# Patient Record
Sex: Female | Born: 1990 | Race: Black or African American | Hispanic: No | Smoking: Never smoker
Health system: Southern US, Community
[De-identification: ages and names within clinical notes are randomized; demographics above are authoritative.]

## PROBLEM LIST (undated history)

## (undated) DIAGNOSIS — N760 Acute vaginitis: Principal | ICD-10-CM

## (undated) DIAGNOSIS — B9689 Other specified bacterial agents as the cause of diseases classified elsewhere: Secondary | ICD-10-CM

## (undated) DIAGNOSIS — D509 Iron deficiency anemia, unspecified: Secondary | ICD-10-CM

## (undated) DIAGNOSIS — O99019 Anemia complicating pregnancy, unspecified trimester: Secondary | ICD-10-CM

## (undated) DIAGNOSIS — R801 Persistent proteinuria, unspecified: Secondary | ICD-10-CM

## (undated) DIAGNOSIS — O1490 Unspecified pre-eclampsia, unspecified trimester: Secondary | ICD-10-CM

## (undated) HISTORY — PX: UMBILICAL HERNIA REPAIR: SHX196

---
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
## 2012-02-23 ENCOUNTER — Emergency Department: Payer: Self-pay | Admitting: Emergency Medicine

## 2012-02-23 LAB — URINALYSIS, COMPLETE
Bacteria: NONE SEEN
Glucose,UR: NEGATIVE mg/dL (ref 0–75)
Nitrite: NEGATIVE
Protein: >=500
WBC UR: 17 /HPF (ref 0–5)

## 2012-02-23 LAB — CBC
HCT: 34 % — ABNORMAL LOW (ref 35.0–47.0)
HGB: 10.9 g/dL — ABNORMAL LOW (ref 12.0–16.0)
MCH: 23.6 pg — ABNORMAL LOW (ref 26.0–34.0)
MCHC: 32 g/dL (ref 32.0–36.0)
RBC: 4.6 10*6/uL (ref 3.80–5.20)
WBC: 4.8 10*3/uL (ref 3.6–11.0)

## 2012-02-23 LAB — COMPREHENSIVE METABOLIC PANEL
Albumin: 3.9 g/dL (ref 3.8–5.6)
Alkaline Phosphatase: 67 U/L — ABNORMAL LOW (ref 82–169)
BUN: 12 mg/dL (ref 7–18)
Bilirubin,Total: 0.4 mg/dL (ref 0.2–1.0)
Creatinine: 0.46 mg/dL — ABNORMAL LOW (ref 0.60–1.30)
EGFR (African American): >60
EGFR (Non-African Amer.): >60
Glucose: 76 mg/dL (ref 65–99)
Osmolality: 270 (ref 275–301)
SGPT (ALT): 12 U/L (ref 12–78)
Sodium: 136 mmol/L (ref 136–145)

## 2012-02-23 LAB — HCG, QUANTITATIVE, PREGNANCY: Beta Hcg, Quant.: 191228 m[IU]/mL — ABNORMAL HIGH

## 2012-03-08 ENCOUNTER — Encounter: Payer: Self-pay | Admitting: Maternal & Fetal Medicine

## 2012-03-08 LAB — URINALYSIS, COMPLETE
Bilirubin,UR: NEGATIVE
Blood: NEGATIVE
Glucose,UR: 50 mg/dL (ref 0–75)
Leukocyte Esterase: NEGATIVE
Nitrite: NEGATIVE
Ph: 7 (ref 4.5–8.0)
Specific Gravity: 1.026 (ref 1.003–1.030)
Squamous Epithelial: 2
WBC UR: 6 /HPF (ref 0–5)

## 2012-03-09 ENCOUNTER — Other Ambulatory Visit: Payer: Self-pay | Admitting: Maternal & Fetal Medicine

## 2012-03-09 LAB — PROTEIN, URINE, 24 HOUR
Protein, 24 Hour Urine: 600 mg/24HR — ABNORMAL HIGH (ref 30–149)
Protein, Urine: 100 mg/dL (ref 0–12)

## 2012-04-05 ENCOUNTER — Encounter: Payer: Self-pay | Admitting: Obstetrics and Gynecology

## 2012-05-08 ENCOUNTER — Ambulatory Visit: Payer: Self-pay | Admitting: Hematology and Oncology

## 2012-05-24 ENCOUNTER — Ambulatory Visit: Payer: Self-pay | Admitting: Hematology and Oncology

## 2012-05-31 LAB — CBC CANCER CENTER
Basophil #: 0.1 x10 3/mm (ref 0.0–0.1)
Basophil %: 0.7 %
Eosinophil #: 0.1 x10 3/mm (ref 0.0–0.7)
Eosinophil %: 1.4 %
HGB: 7.6 g/dL — ABNORMAL LOW (ref 12.0–16.0)
Lymphocyte #: 1.5 x10 3/mm (ref 1.0–3.6)
Lymphocyte %: 19.4 %
MCH: 23.4 pg — ABNORMAL LOW (ref 26.0–34.0)
MCV: 75 fL — ABNORMAL LOW (ref 80–100)
Monocyte #: 0.4 x10 3/mm (ref 0.2–0.9)
Neutrophil #: 5.6 x10 3/mm (ref 1.4–6.5)
Platelet: 292 x10 3/mm (ref 150–440)
RBC: 3.24 10*6/uL — ABNORMAL LOW (ref 3.80–5.20)
RDW: 17 % — ABNORMAL HIGH (ref 11.5–14.5)

## 2012-06-08 ENCOUNTER — Ambulatory Visit: Payer: Self-pay | Admitting: Hematology and Oncology

## 2012-06-21 ENCOUNTER — Encounter: Payer: Self-pay | Admitting: Maternal and Fetal Medicine

## 2012-06-22 ENCOUNTER — Observation Stay: Payer: Self-pay

## 2012-06-22 LAB — COMPREHENSIVE METABOLIC PANEL
Albumin: 3.2 g/dL — ABNORMAL LOW (ref 3.8–5.6)
Alkaline Phosphatase: 80 U/L — ABNORMAL LOW (ref 82–169)
Bilirubin,Total: 0.4 mg/dL (ref 0.2–1.0)
Calcium, Total: 9 mg/dL (ref 9.0–10.7)
Chloride: 106 mmol/L (ref 98–107)
Co2: 26 mmol/L (ref 21–32)
Creatinine: 0.46 mg/dL — ABNORMAL LOW (ref 0.60–1.30)
EGFR (African American): >60
EGFR (Non-African Amer.): >60
Glucose: 98 mg/dL (ref 65–99)
Osmolality: 275 (ref 275–301)
SGOT(AST): 17 U/L (ref 0–26)
SGPT (ALT): 15 U/L (ref 12–78)
Sodium: 138 mmol/L (ref 136–145)

## 2012-06-22 LAB — CBC WITH DIFFERENTIAL/PLATELET
Basophil #: 0 10*3/uL (ref 0.0–0.1)
Eosinophil #: 0 10*3/uL (ref 0.0–0.7)
Eosinophil %: 0.6 %
Lymphocyte #: 0.7 10*3/uL — ABNORMAL LOW (ref 1.0–3.6)
Lymphocyte %: 11.1 %
Monocyte %: 8.9 %
Neutrophil %: 79.1 %
Platelet: 277 10*3/uL (ref 150–440)
WBC: 6.3 10*3/uL (ref 3.6–11.0)

## 2012-06-22 LAB — URINALYSIS, COMPLETE
Blood: NEGATIVE
Specific Gravity: 1.026 (ref 1.003–1.030)
Transitional Epi: <1
WBC UR: 40 /HPF (ref 0–5)

## 2012-06-23 LAB — HEMOGLOBIN: HGB: 9.8 g/dL — ABNORMAL LOW (ref 12.0–16.0)

## 2012-08-09 ENCOUNTER — Encounter: Payer: Self-pay | Admitting: Obstetrics & Gynecology

## 2012-08-14 ENCOUNTER — Other Ambulatory Visit: Payer: Self-pay | Admitting: Obstetrics and Gynecology

## 2012-08-14 LAB — PROTEIN, URINE, 24 HOUR
Protein, Urine: 56 mg/dL (ref 0–12)
Total Volume: 800 mL

## 2012-08-14 LAB — CREATININE, URINE, 24 HOUR
Creatinine, 24 Hr Urine: 761 mg/24hr (ref 600–1000)
Creatinine, Urine: 95.1 mg/dL (ref 30.0–125.0)

## 2012-09-02 ENCOUNTER — Inpatient Hospital Stay: Payer: Self-pay | Admitting: Obstetrics and Gynecology

## 2012-09-02 LAB — CBC WITH DIFFERENTIAL/PLATELET
Basophil %: 0.5 %
Eosinophil %: 1.6 %
HGB: 11.6 g/dL — ABNORMAL LOW (ref 12.0–16.0)
Lymphocyte #: 1.4 10*3/uL (ref 1.0–3.6)
MCH: 29.5 pg (ref 26.0–34.0)
MCHC: 34.4 g/dL (ref 32.0–36.0)
MCV: 86 fL (ref 80–100)
Monocyte #: 0.8 x10 3/mm (ref 0.2–0.9)
Monocyte %: 9.3 %
Neutrophil #: 5.9 10*3/uL (ref 1.4–6.5)
Platelet: 274 10*3/uL (ref 150–440)
RDW: 21.2 % — ABNORMAL HIGH (ref 11.5–14.5)

## 2012-09-03 LAB — PIH PROFILE
Anion Gap: 5 — ABNORMAL LOW (ref 7–16)
BUN: 8 mg/dL (ref 7–18)
Co2: 26 mmol/L (ref 21–32)
Creatinine: 0.56 mg/dL — ABNORMAL LOW (ref 0.60–1.30)
EGFR (African American): >60
EGFR (Non-African Amer.): >60
Glucose: 79 mg/dL (ref 65–99)
MCH: 29.7 pg (ref 26.0–34.0)
MCV: 86 fL (ref 80–100)
Osmolality: 273 (ref 275–301)
Platelet: 265 10*3/uL (ref 150–440)
RBC: 3.84 10*6/uL (ref 3.80–5.20)
RDW: 20.9 % — ABNORMAL HIGH (ref 11.5–14.5)
Sodium: 138 mmol/L (ref 136–145)
WBC: 7.4 10*3/uL (ref 3.6–11.0)

## 2012-09-03 LAB — PROTEIN / CREATININE RATIO, URINE
Creatinine, Urine: 117.9 mg/dL (ref 30.0–125.0)
Protein/Creat. Ratio: 577 mg/gCREAT — ABNORMAL HIGH (ref 0–200)

## 2012-09-03 LAB — GC/CHLAMYDIA PROBE AMP

## 2013-01-31 ENCOUNTER — Emergency Department: Payer: Self-pay | Admitting: Emergency Medicine

## 2013-07-09 IMAGING — US US OB DETAIL+14 WK - NRPT [facility]
1 series · 14 of 28 positions shown · non-contrast
Comparison: none

[Series 1: us ob detail+14 wk - nrpt [facility] · 0.13mm/px · 14 of 112 slices shown]
[im 5/112]
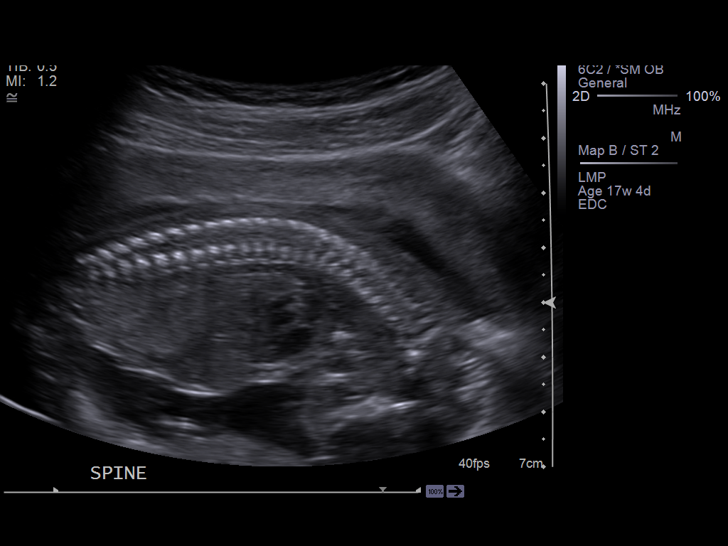
[im 13/112]
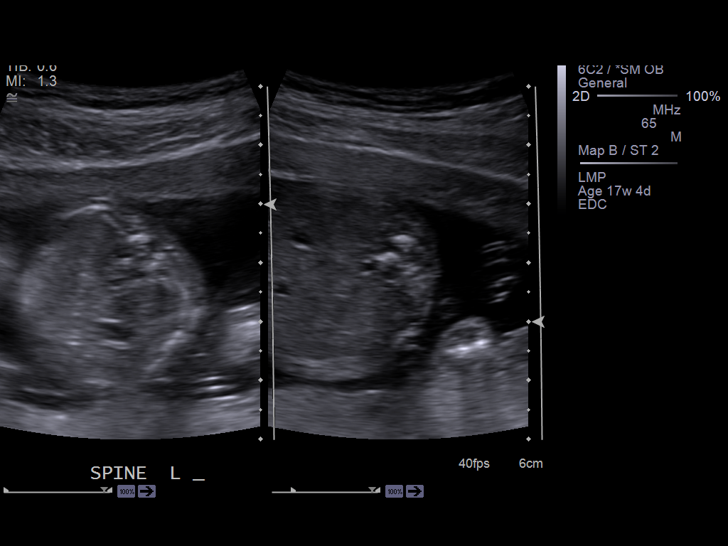
[im 21/112]
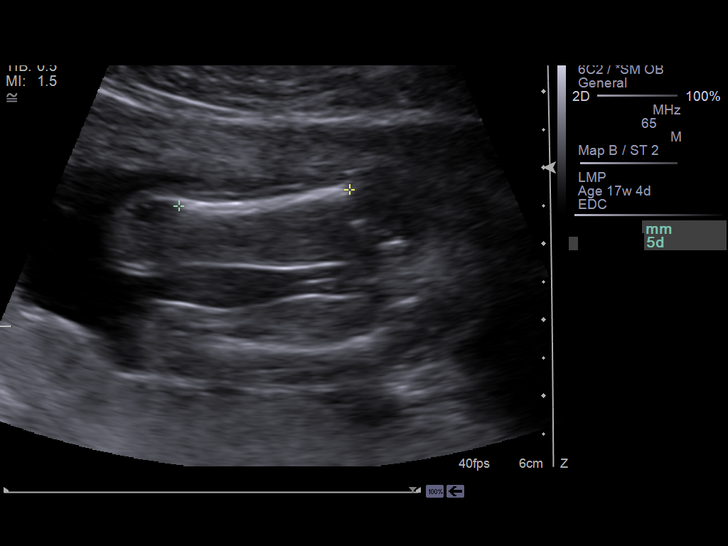
[im 29/112]
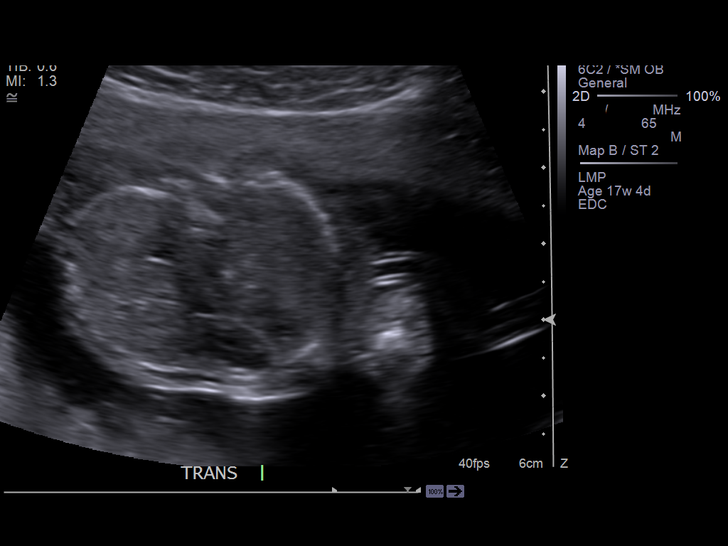
[im 38/112]
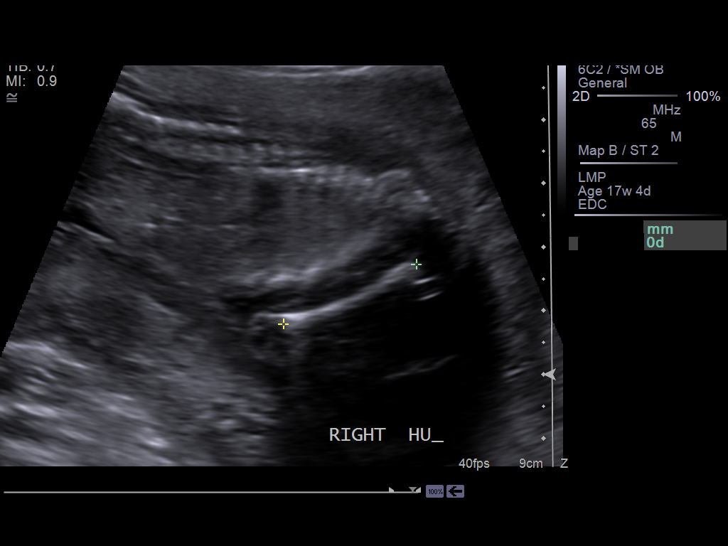
[im 46/112]
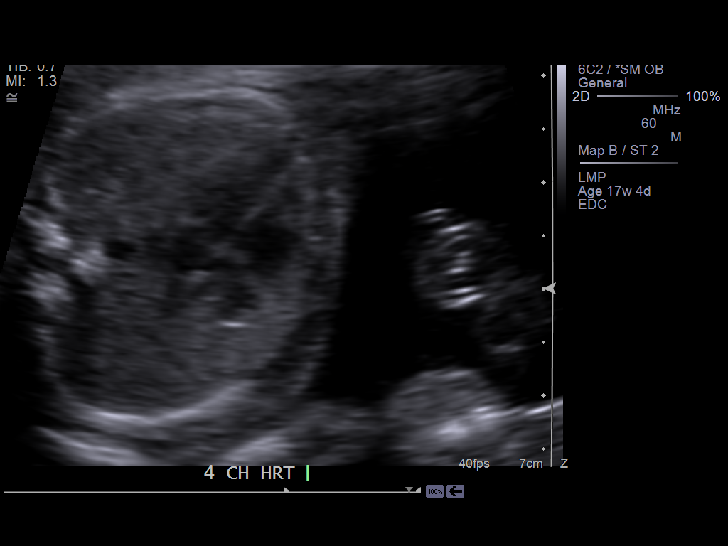
[im 54/112]
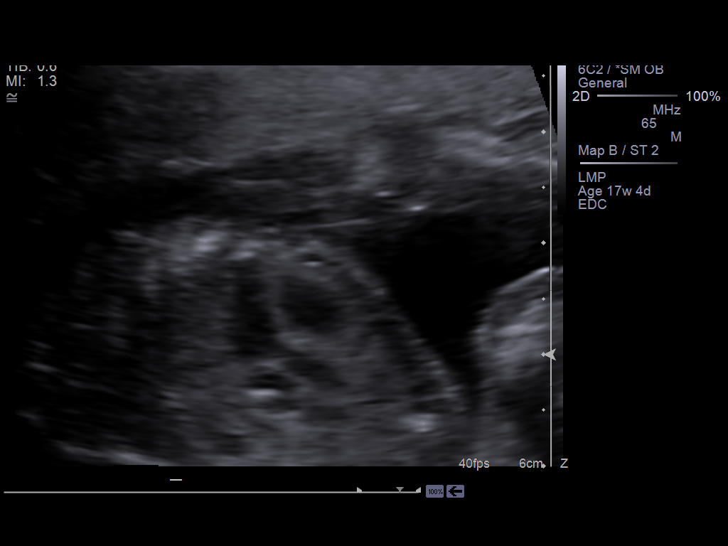
[im 62/112]
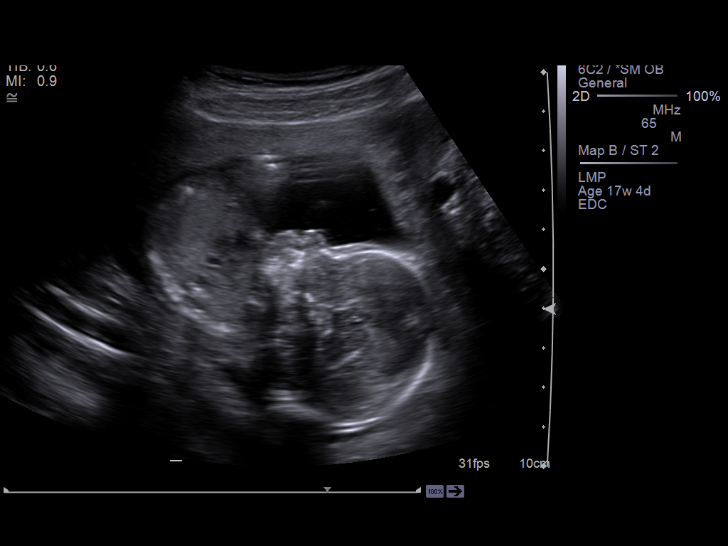
[im 70/112]
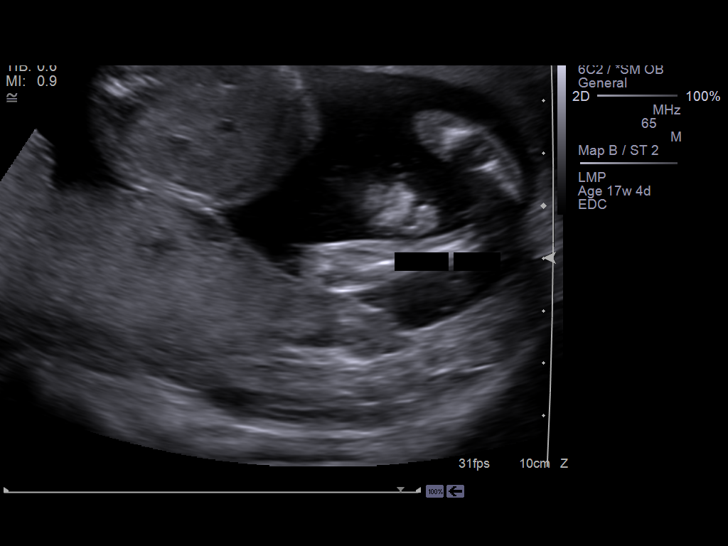
[im 79/112]
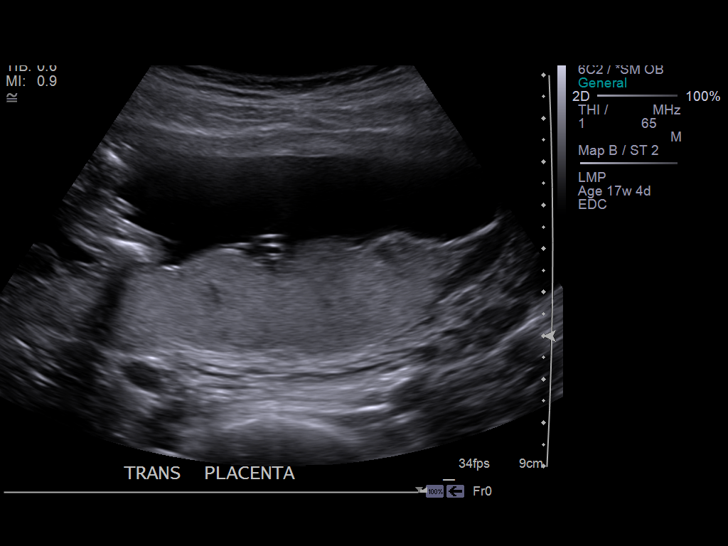
[im 87/112]
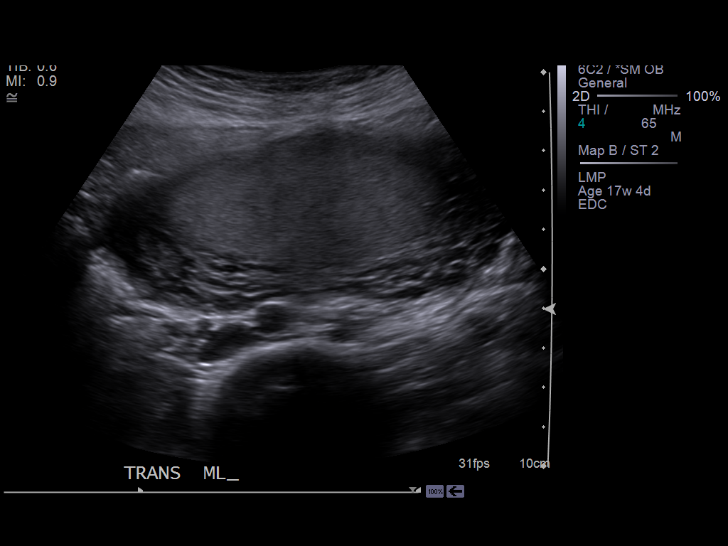
[im 95/112]
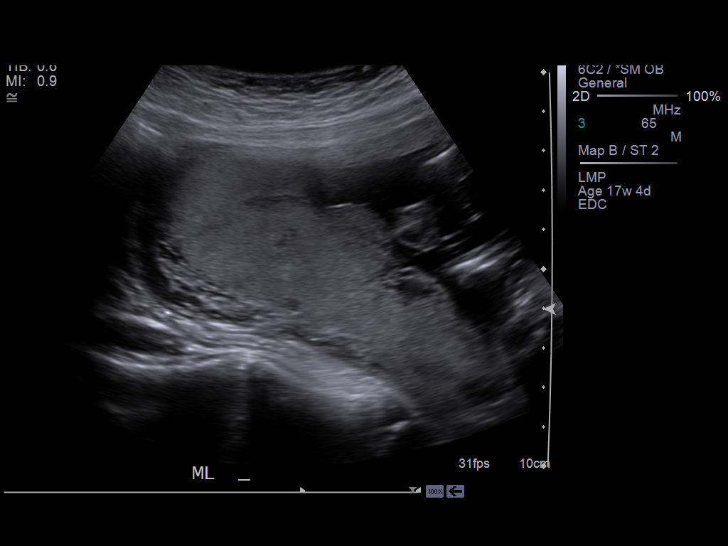
[im 103/112]
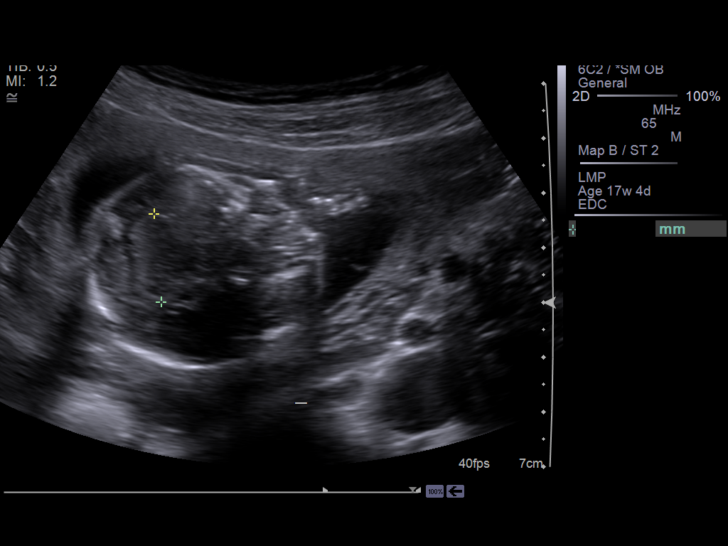
[im 112/112]
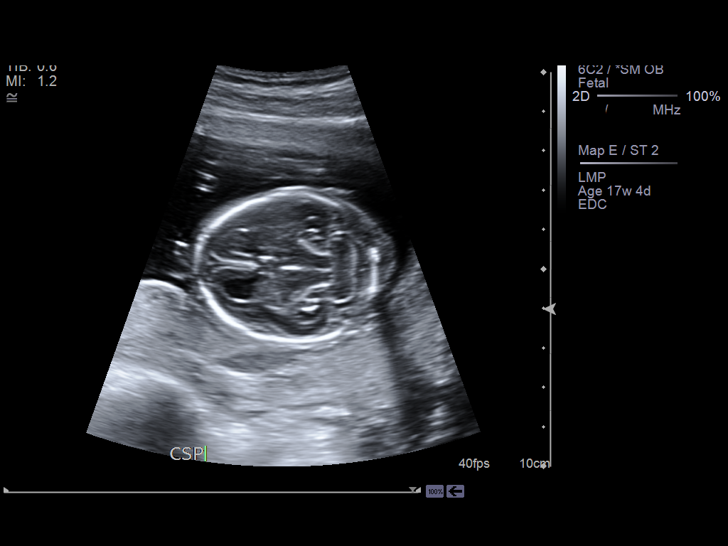

[14 of 28 positions shown; findings below may reference images not displayed]

IMAGES IMPORTED FROM THE [employer] WORKFLOW SYSTEM
NO DICTATION FOR STUDY

## 2013-09-24 IMAGING — US US OB FOLLOW-UP - [facility]PT [facility]
1 series · 14 of 28 positions shown · non-contrast
Comparison: none

[Series 1: us ob follow-up - nrpt [facility] · 0.25mm/px · 14 of 58 slices shown]
[im 3/58]
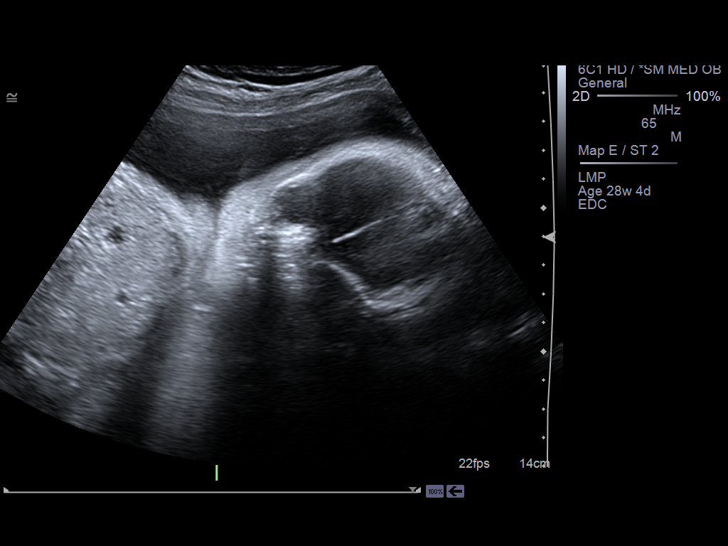
[im 7/58]
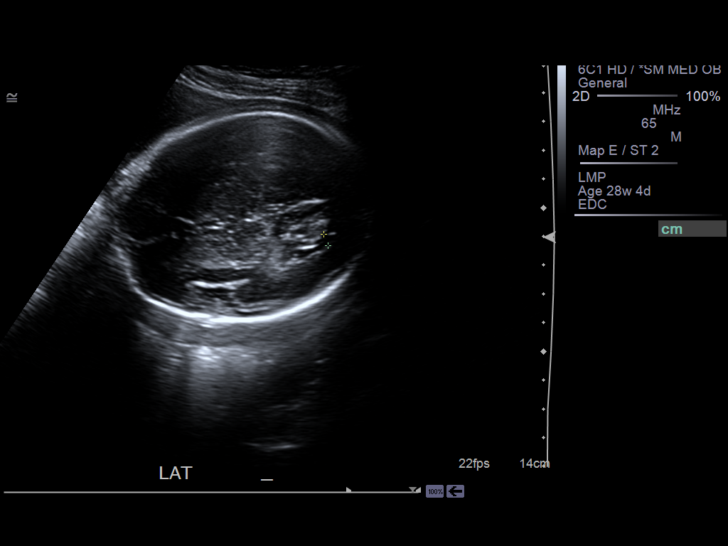
[im 11/58]
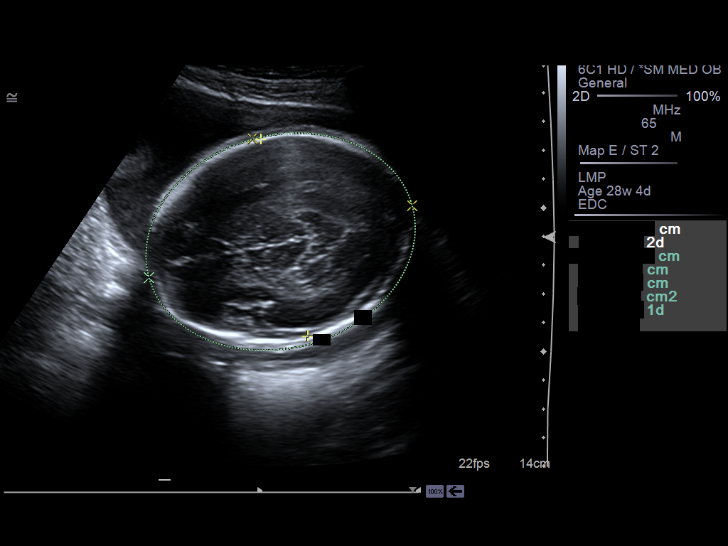
[im 15/58]
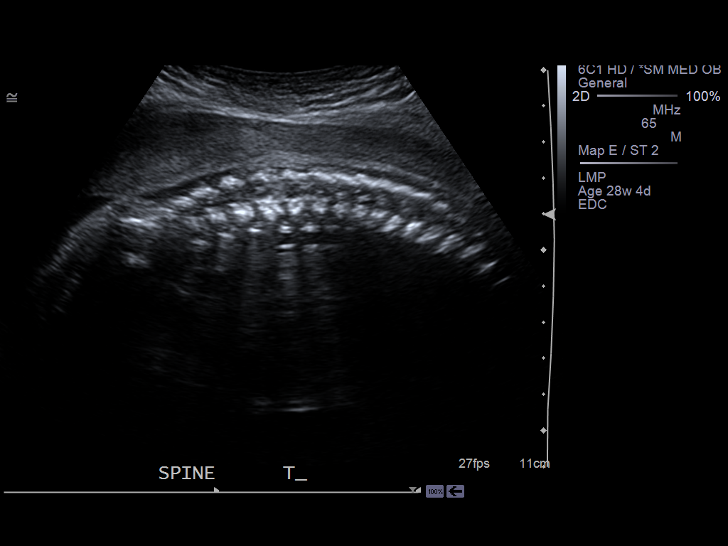
[im 20/58]
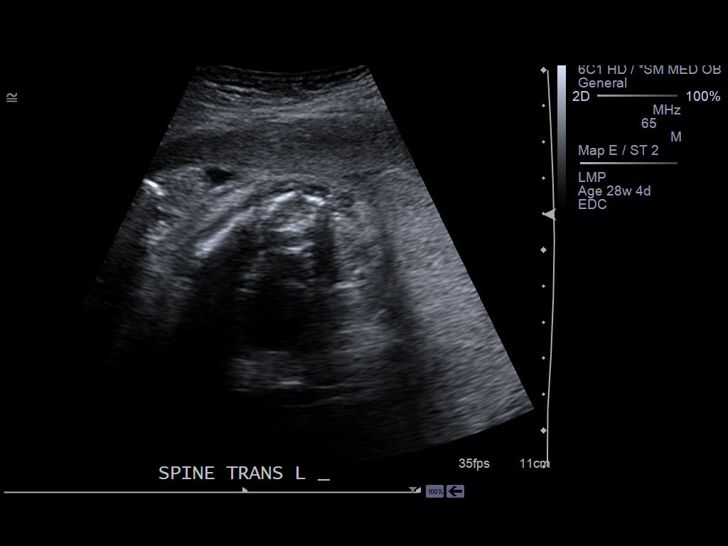
[im 24/58]
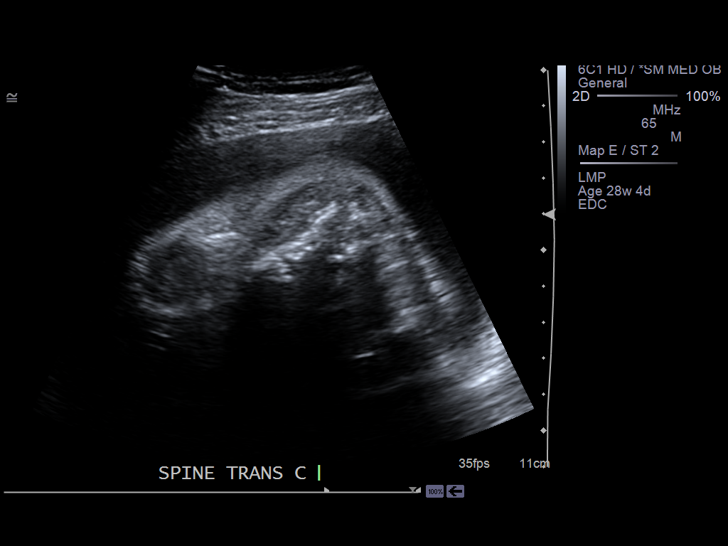
[im 28/58]
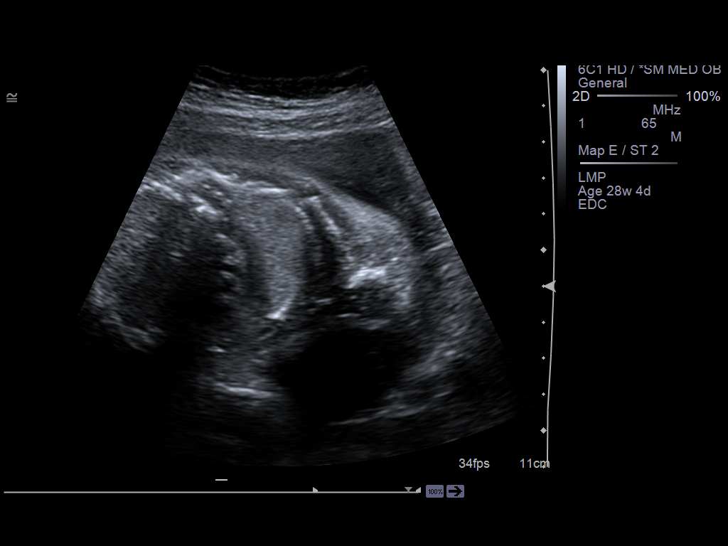
[im 32/58]
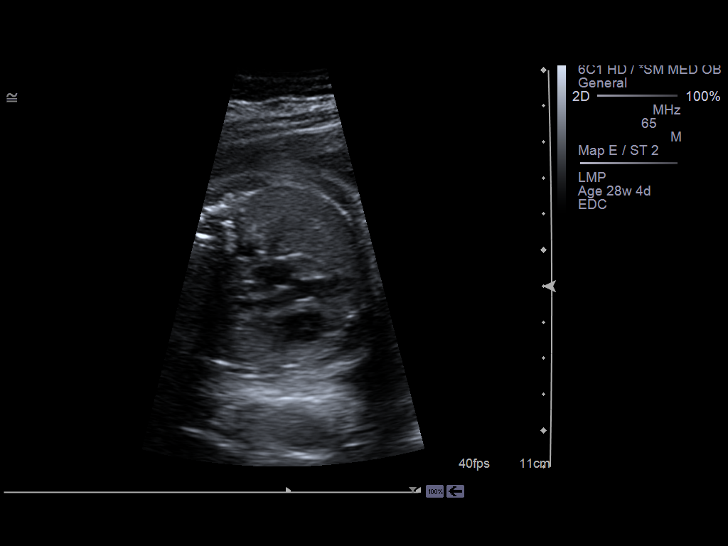
[im 36/58]
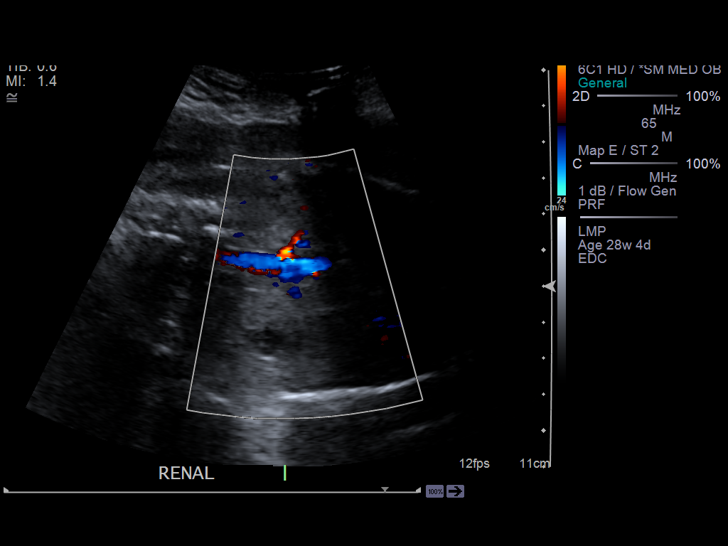
[im 41/58]
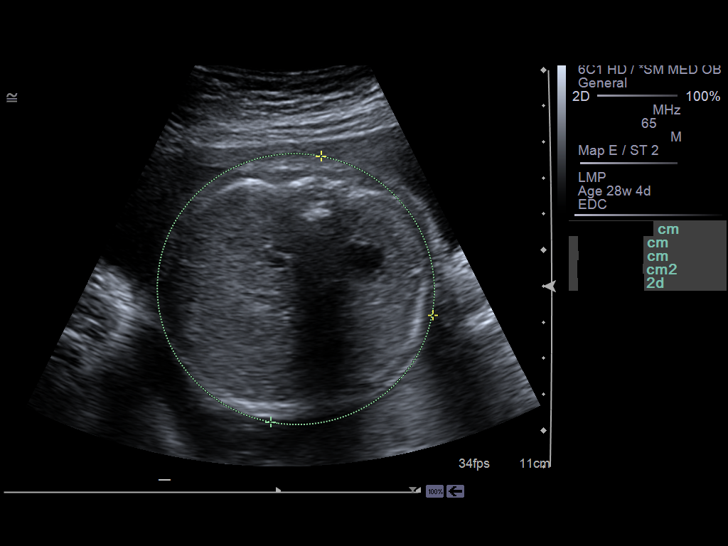
[im 45/58]
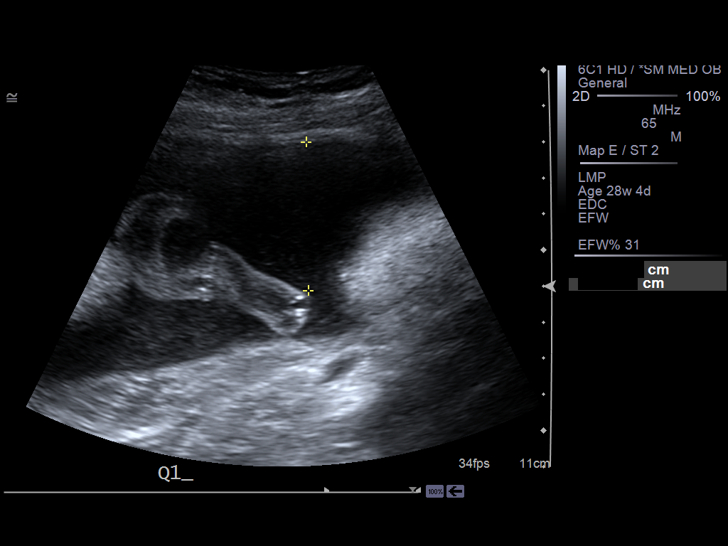
[im 49/58]
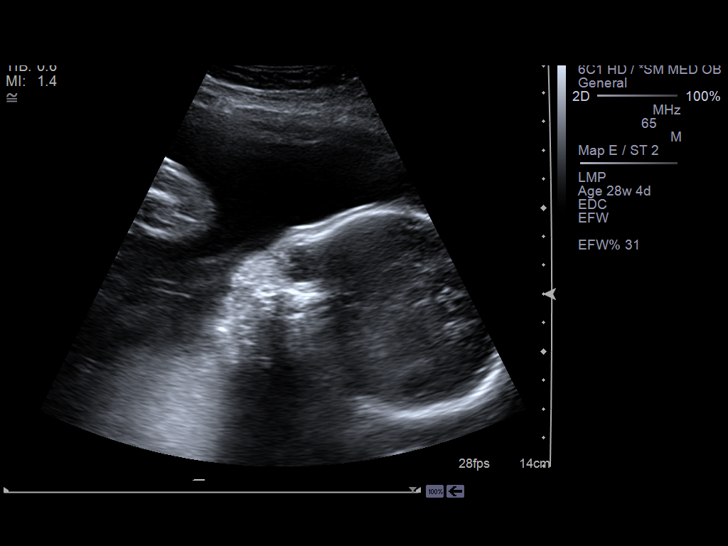
[im 53/58]
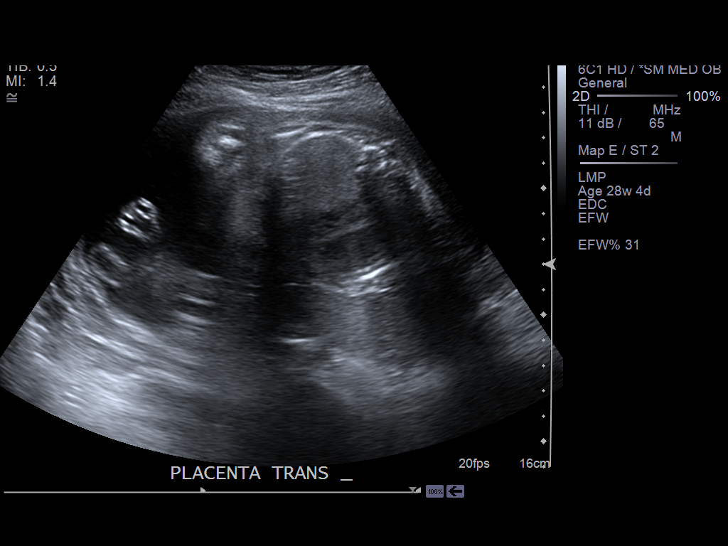
[im 58/58]
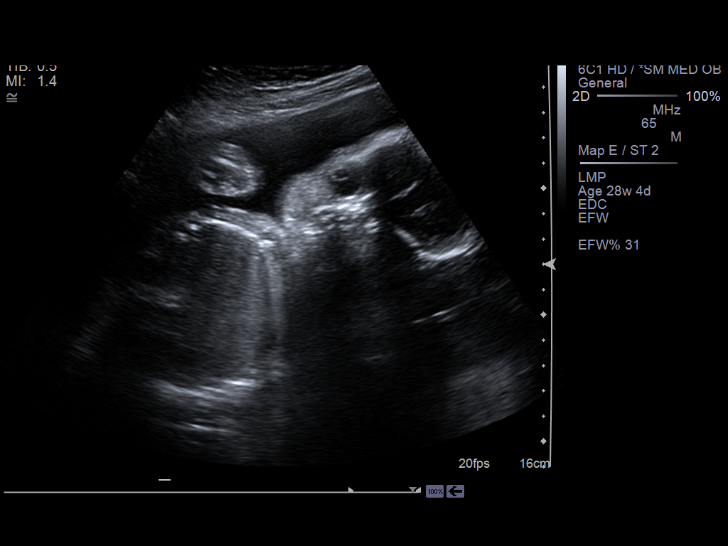

[14 of 28 positions shown; findings below may reference images not displayed]

IMAGES IMPORTED FROM THE [employer] WORKFLOW SYSTEM
NO DICTATION FOR STUDY

## 2013-11-12 IMAGING — US US OB FOLLOW-UP - [facility]PT [facility]
1 series · 14 of 28 positions shown · non-contrast
Comparison: none

[Series 1: us ob follow-up - [facility]pt [facility] · 0.26mm/px · 14 of 70 slices shown]
[im 3/70]
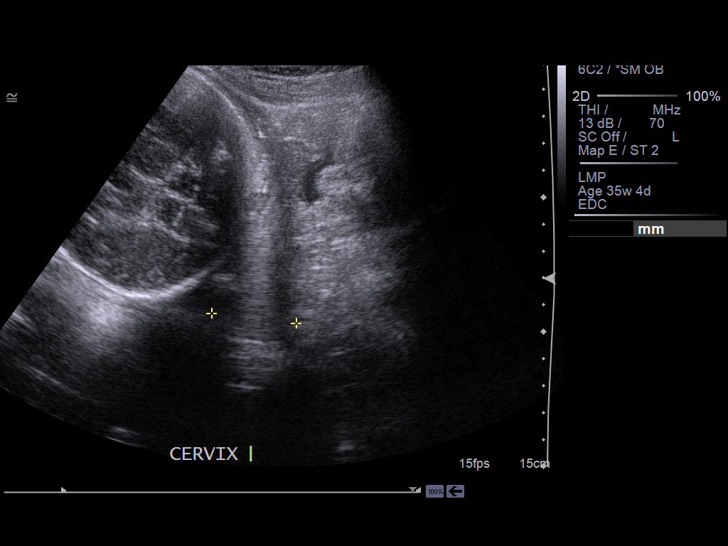
[im 8/70]
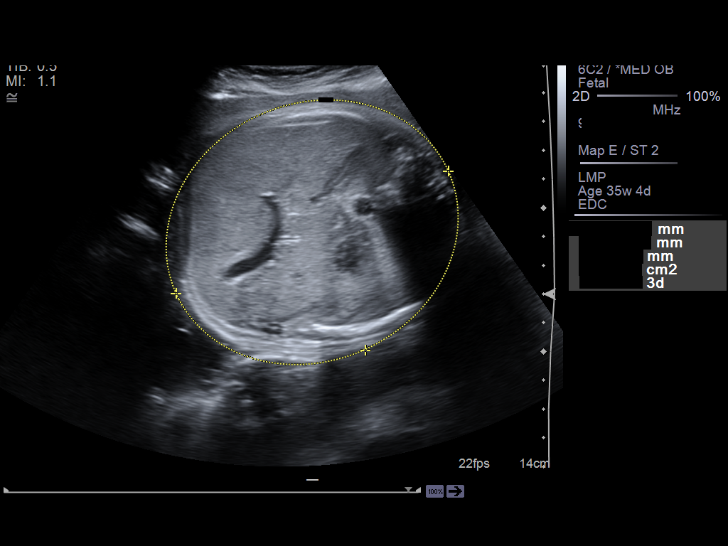
[im 13/70]
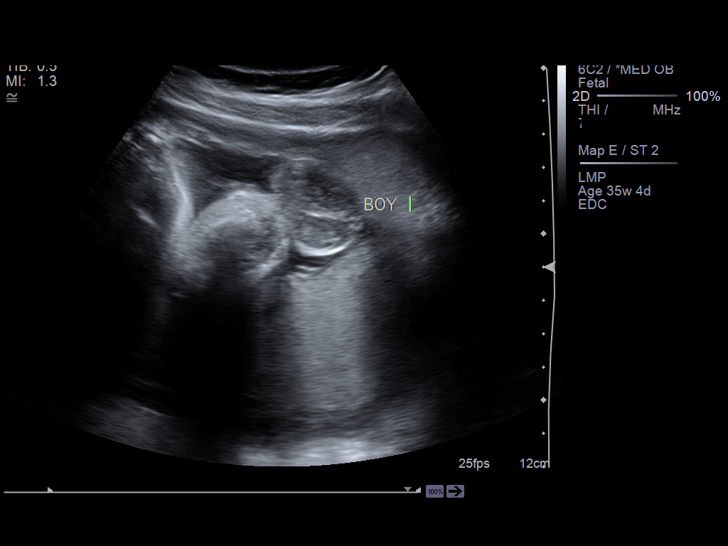
[im 18/70]
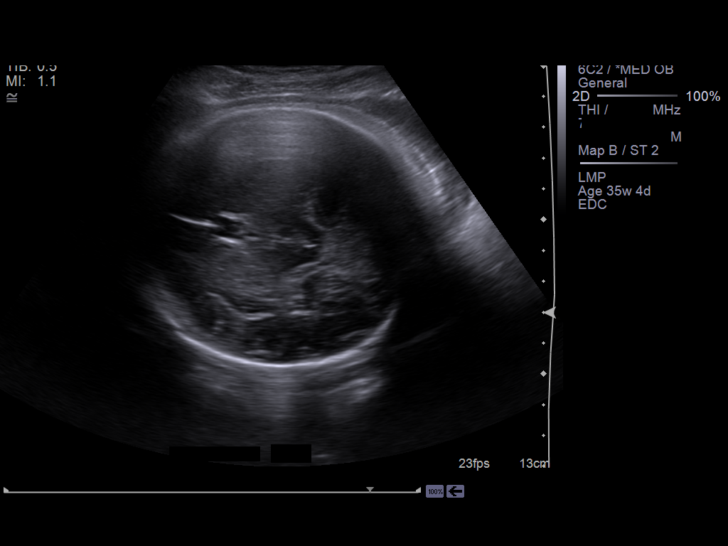
[im 24/70]
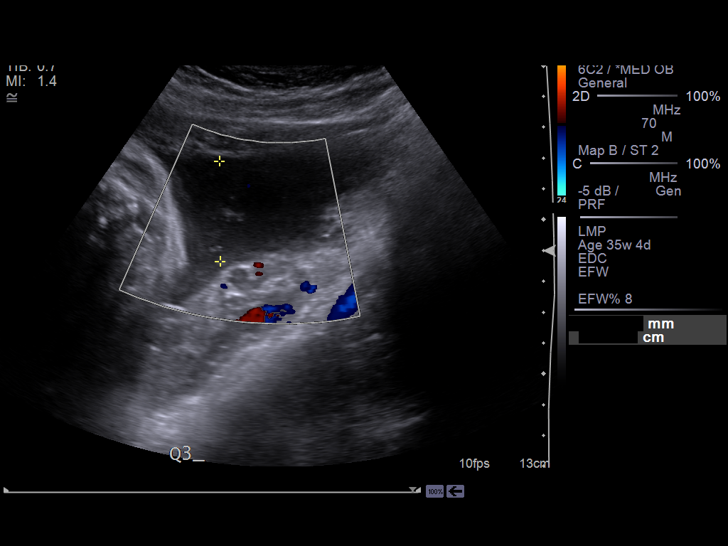
[im 29/70]
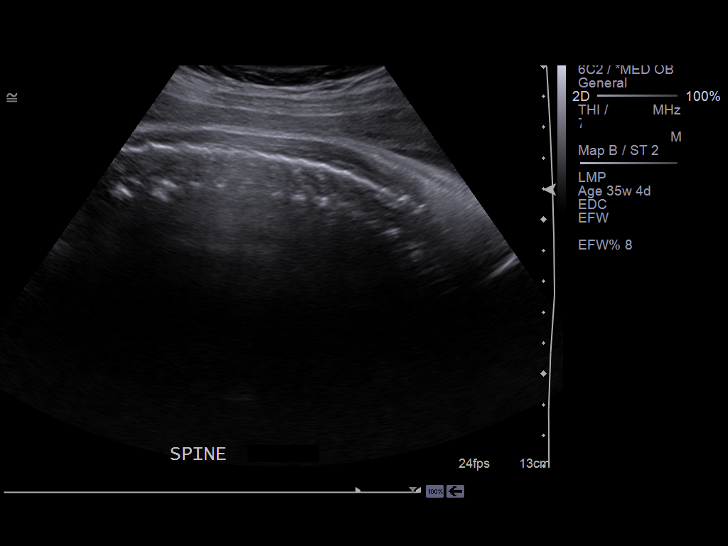
[im 34/70]
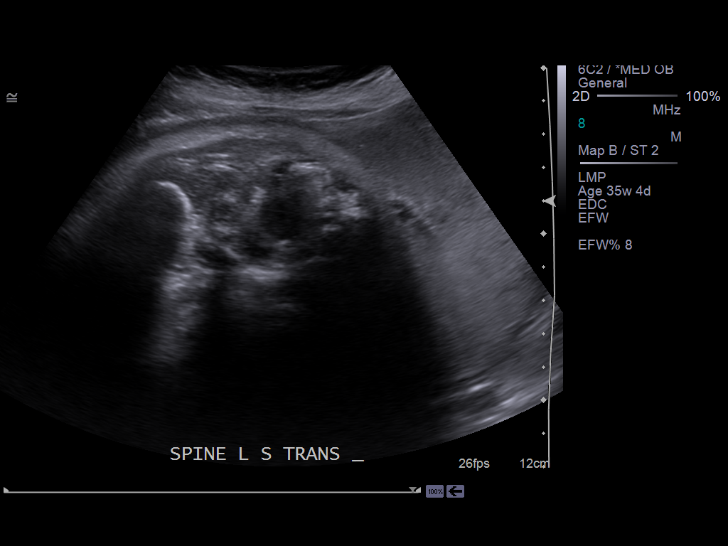
[im 39/70]
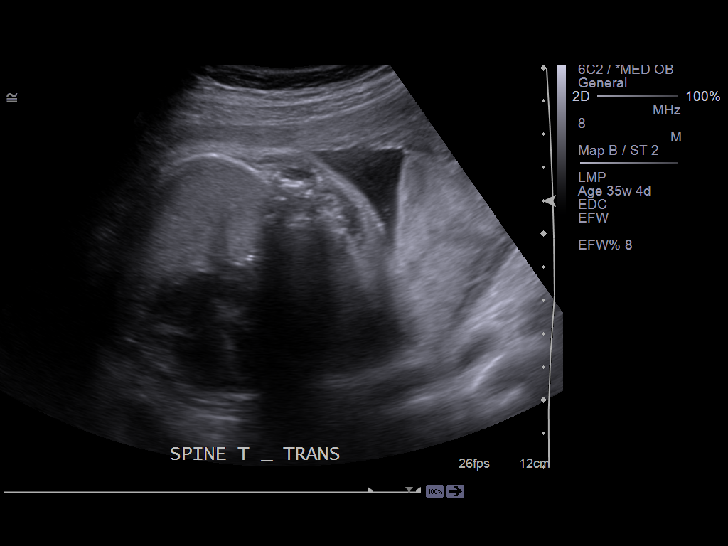
[im 44/70]
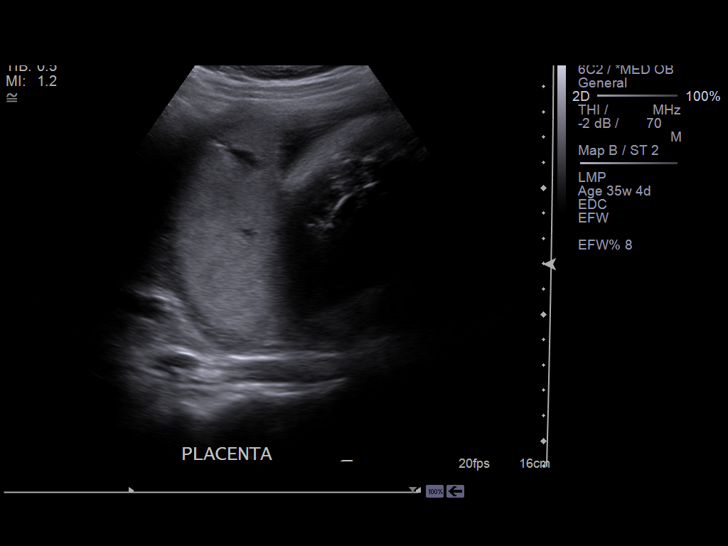
[im 49/70]
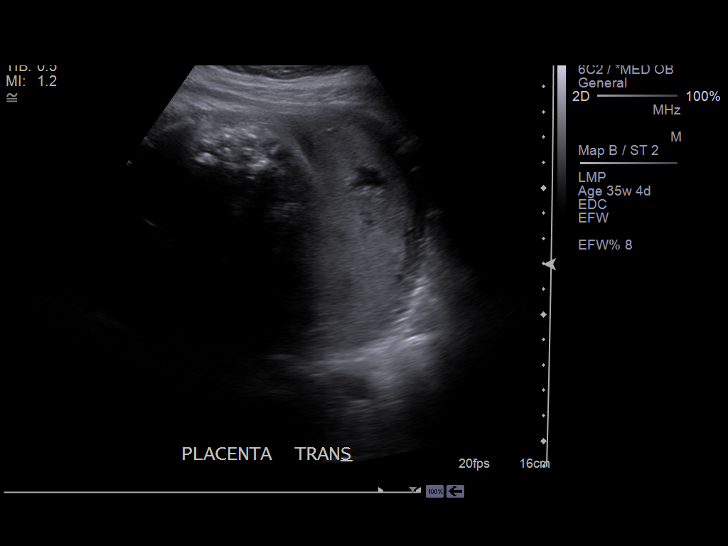
[im 54/70]
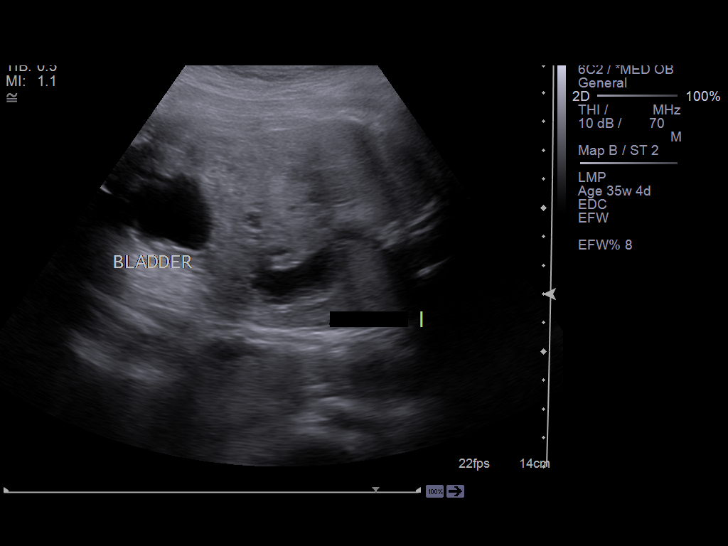
[im 59/70]
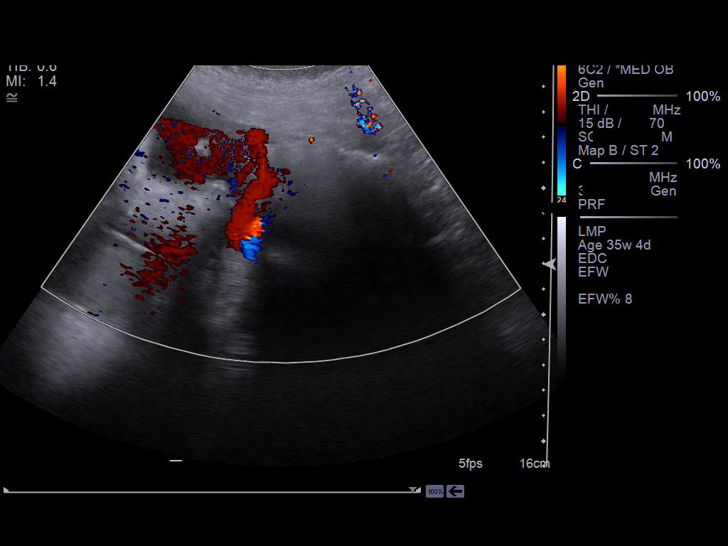
[im 64/70]
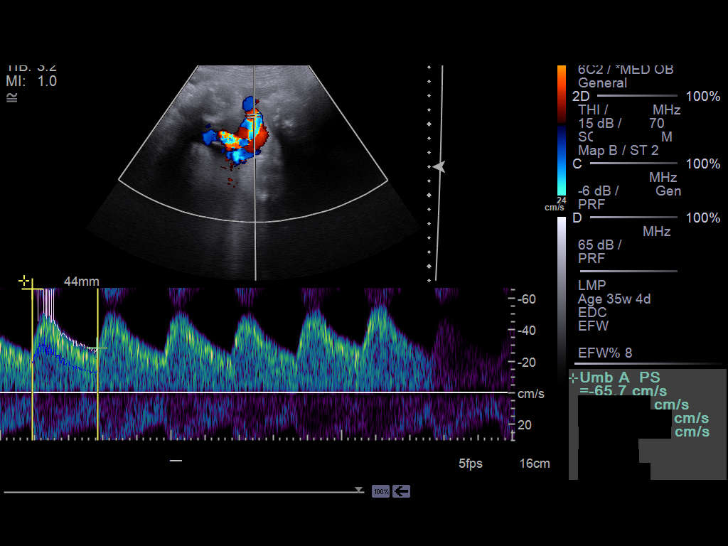
[im 70/70]
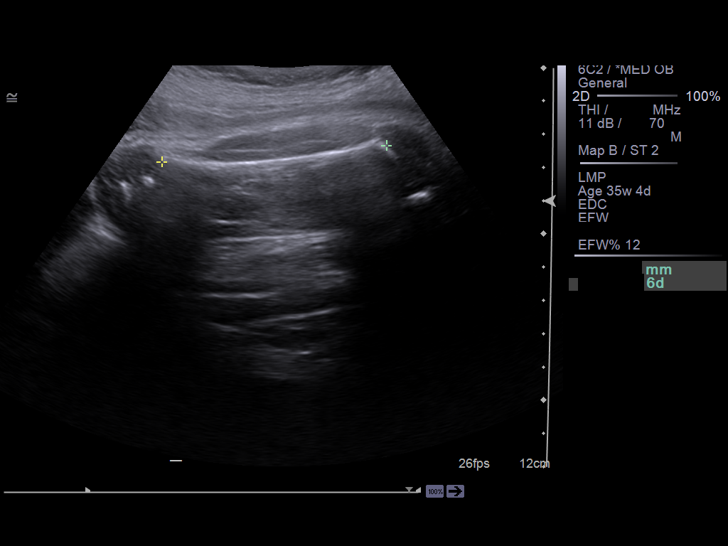

[14 of 28 positions shown; findings below may reference images not displayed]

IMAGES IMPORTED FROM THE [employer] WORKFLOW SYSTEM
NO DICTATION FOR STUDY

## 2014-06-27 NOTE — Consult Note (Signed)
Referral Information:   Reason for Referral 24 yo G1 at 13 weeks 4days by referred for consultation due to proteinuria.  Pt noted to have +2-+3 protein on urine dipstick and 663mg  protein in 24hour urine (02/23/2012).  She was treated for UTI 02/02/2012.  She has no history of chronic hypertension, renal disease, or recurrent UTI.  She denies family history of renal disease.    Referring Physician [facility]    Prenatal Hx N/V--on zofran.  she reports symptoms improving. some days she has no N/V HIV negative(02/03/12) Cr .5    Past Obstetrical Hx nuliparous   Home Medications: Medication Instructions Status  Zofran ODT 4 mg oral tablet, disintegrating 1  orally 3 times a day, As Needed- for Nausea, Vomiting  Active   Allergies:   No Known Allergies:   Vital Signs/Notes:  Nursing Vital Signs: **Vital Signs.:   30-Dec-13 08:28   Vital Signs Type Routine   Temperature Temperature (F) 98.6   Celsius 37   Pulse Pulse 100   Respirations Respirations 16   Systolic BP Systolic BP 111   Diastolic BP (mmHg) Diastolic BP (mmHg) 56   Mean BP 74   Perinatal Consult:   PGyn Hx regular menses    PMed Hx Rubella Immune, varicella nonimmune    PSurg Hx umbilical hernia repair age 89    Occupation Mother presently not working (worked at [employer] prior to pregnancy)    Occupation Father retail    Soc Hx denies alcohol, tobacco or ilicit drug use   Review Of Systems:   Fever/Chills No    Cough No    Abdominal Pain No    Diarrhea No    Constipation No    Nausea/Vomiting Yes    SOB/DOE No    Chest Pain No    Dysuria No    Tolerating Diet Yes  Nauseated  Vomiting  intermittent nausea/vomiting    Medications/Allergies Reviewed Medications/Allergies reviewed     Routine UA:  30-Dec-13 08:38    Color (UA) Yellow   Clarity (UA) Hazy   Glucose (UA) 50 mg/dL   Bilirubin (UA) Negative   Ketones (UA) Trace   Specific Gravity (UA) 1.026   Blood (UA) Negative   pH (UA)  7.0   Protein (UA) 100 mg/dL   Nitrite (UA) Negative   Leukocyte Esterase (UA) Negative (Result(s) reported on 08 Mar 2012 at 09:06AM.)   RBC (UA) 1 /HPF   WBC (UA) 6 /HPF   Bacteria (UA) NONE SEEN   Epithelial Cells (UA) 2 /HPF   Mucous (UA) PRESENT   Amorphous Crystal (UA) PRESENT (Result(s) reported on 08 Mar 2012 at 09:06AM.)   Impression/Recommendations:   Impression 24 yo with history of proteinuria.  Today's urine dipstick was negative for protein.  We addressed the possiblity that UTI may result in transient proteinuria.  Prior to obtaining these results we discussed the following: the possible etiologies of elevated urine protein including possible anatomic conditions such as reflux, intrinsic renal disease, hypertensive disorders or acquired nephropathy.  We also addressed pregnancy related concerns in the setting of proteinuria such as risk for preeclampsia (approx 15% risk) and fetal growth restriction.  We discussed the possible need for transfer to obgyn or high risk providers if she develops evidence of hypertension during pregnancy.    The urine protein was NEGATIVE today. I have requested a repeat 24 hour urine protein. If this testing is negative, she can resume routine care.  If the 24 hour urine protein is,  again positive,  we will proceed with the referrals and follow up outlined below.    Recommendations 1. Repeat 24 hour urine protein.  If NEGATIVE, she should proceed with routine, prenatal care. 2. If the 24 hour urine protein is again POSITIVE,  I would recommend the following (we will schedule): --renal ultrasound --nephrology consultation --repeat 24 hour urine protein each trimester --low dose aspirin --detailed anatomic survey, follow up growth at 26-28 weeks and 32-34 weeks, antenatal testing weekly at 36weeks or sooner if clinically indicated Meleah is aware the above recommendations will be initiated if her 24 hour urine is positive, otherwise, she will resume  routine care. 3. We have scheduled a follow up consultation and anatomic survey in 4 weeks.     Total Time Spent with Patient 45 minutes    >50% of visit spent in couseling/coordination of care yes   Coding Description: MATERNAL CONDITIONS/HISTORY INDICATION(S).   OTHER: Proteinura.  Electronic Signatures: Consuelo Pandy (MD)  (Signed 30-Dec-13 11:53)  Authored: Referral, Home Medications, Allergies, Vital Signs/Notes, Consult, Exam, Lab, Impression, Billing, Coding Description   Last Updated: 30-Dec-13 11:53 by Consuelo Pandy (MD)

## 2014-06-30 NOTE — Op Note (Signed)
PATIENT NAME:  Sara Stanley, Sara Stanley MR#:  [id number] DATE OF BIRTH:  06-Jul-1990  DATE OF PROCEDURE:  09/05/2012  PREOPERATIVE DIAGNOSES:  1. Term gestation.  2. Chronic proteinuria.  3. Induction at 39+ weeks.  4. Unsuccessful induction day 3.   POSTOPERATIVE DIAGNOSES:  1. Term gestation.  2. Chronic proteinuria.  3. Induction at 39+ weeks.  4. Unsuccessful induction day 3.   PROCEDURE: Primary low transverse cesarean section.   SURGEON: Ricky L. Logan BoresEvans, M.D.   ASSISTANT: Acquanetta BellingAngela Lugiano, CNM.   ANESTHESIA: Spinal by Dr. Pernell DupreAdams.   FINDINGS: Grossly normal abdomen, uterus, tubes and ovaries. Vigorous female infant, Apgars 8 and 9 weight 2848 grams, I believe, 6 pounds 4 ounces.   ESTIMATED BLOOD LOSS: 650.   COMPLICATIONS: None.   SPECIMENS: None.   PROCEDURE IN DETAIL: The patient was on day 3 of induction as planned per perinatologist, due to chronic proteinuria/renal disease. Progressed up to 4 to 5 cm, but the infant would not tolerate augmentation. Discussed options with the patient and the family. Recommended cesarean section. Stated understanding and desired to proceed. Consent was signed. The patient was taken to the operating room where spinal was placed. She was then placed in the supine position, prepped and draped in the usual sterile fashion. Foley catheter had been inserted previously. After assuring adequate anesthesia, a #10 blade was used to create a Pfannenstiel incision, and primary low transverse cesarean section was carried out in the usual fashion.   Chromic interlocking on the uterus. The pelvis was irrigated. The rectus muscle hemostatic. The fascia closed left to midline with a running of 0 Vicryl, and then the On-Q pump catheters were placed and tucked under the closed portion of the fascia, and taking care not to incorporate the catheters in the remaining fascial closure, the fascia was closed to the right apex and tied. Subcutaneous was made hemostatic with cautery.  The skin was closed with Insorb surgical clips and the 2 On-Q catheters were primed with 5 mL each of 0.5% Sensorcaine, skin glue, Steri-Strips and Tegaderm per protocol.   The patient tolerated the procedure well. Ancef 2 g was given IV preoperatively. Anticipate routine postoperative course and recommend repeat cesarean section with any subsequent pregnancies.   ____________________________ Reatha Harpsicky L. Logan BoresEvans, MD rle:gb D: 09/05/2012 19:29:38 ET T: 09/05/2012 22:33:46 ET JOB#: [id number]  cc: Clide Clifficky L. Logan BoresEvans, MD, <Dictator> Augustina MoodICK L Dacian Orrico MD ELECTRONICALLY SIGNED 09/09/2012 8:20

## 2014-06-30 NOTE — Consult Note (Signed)
Referral Information:   Reason for Referral 24 yo G1 at 17 weeks 4 days referred for follow up consultation due to proteinuria.  Pt was noted to have +2-+3 protein on urine dipstick at her prenatal visit and subsequently was found to have 663mg  protein in 24hour urine (02/23/2012).  She was treated for UTI 02/02/2012.  She has no history of chronic hypertension, renal disease, or recurrent UTI.  She denies family history of renal disease.  She saw nephrology on 03/25/2012 and has a f/up appointment in February.    Referring Physician [facility]    Prenatal Hx Incidental proteinuria    Past Obstetrical Hx nulliparous   Home Medications: Medication Instructions Status  Prenatal Multivitamins oral tablet 1 tab(s) orally once a day Active  baby aspirin   once a day Active   Allergies:   No Known Allergies:   Vital Signs/Notes:  Nursing Vital Signs: **Vital Signs.:   27-Jan-14 13:21   Temperature Temperature (F) 97.3   Celsius 36.2   Temperature Source oral   Pulse Pulse 105   Respirations Respirations 18   Systolic BP Systolic BP 98   Diastolic BP (mmHg) Diastolic BP (mmHg) 49   Mean BP 65   Perinatal Consult:   PGyn Hx regular menses    PMed Hx Rubella Immune, varicella nonimmune    PSurg Hx Umbilical hernia repair age 24    Occupation Mother Presently not working    Occupation Father Retail    Soc Hx Denies alcohol, tobacco or drug use     [place]:    27-Jan-14 13:01, MFM OB US, Detailed, Single Fetus   MFM OB US, Detailed, Single Fetus    Indication: Anatomy, Proteinuria.   ____________________________________________________________________________  History: Age: 24 years.  ____________________________________________________________________________  Dating:  LMP:     12/04/2011 EDC:     09/09/2012     GA by LMP:     [redacted]w[redacted]d  Current Scan on:     04/05/2012     EDC:     09/11/2012     GA by current scan:      [redacted]w[redacted]d  Best Overall Assessment:     04/05/2012     EDC:      09/09/2012     Assessed GA:      [redacted]w[redacted]d  The calculation ofthe gestational age by current scan was based on BPD, HC, FL  and HUM.  The Best Overall Assessment is based on the LMP.  ____________________________________________________________________________  Anatomy Scan:  Singleton gestation.  Biometry:  BPD     37.9     mm      -     [redacted]w[redacted]d     ([redacted]w[redacted]d to [redacted]w[redacted]d)  HC     142.4     mm      -     [redacted]w[redacted]d     ([redacted]w[redacted]d to [redacted]w[redacted]d)  FL     22.6     mm      -     [redacted]w[redacted]d     ([redacted]w[redacted]d to [redacted]w[redacted]d)  HUM     22.9     mm      -     [redacted]w[redacted]d     Fetal heart activity: present. Fetalheart rate: 153 bpm.   Fetal presentation: cephalic.   Amniotic fluid: normal.   Cord: 3 vessels.   Placenta: posterior. No previa.     Fetal Anatomy:  Visualized with normal appearance: brain, face, spine, abdominal wall,  gastrointestinal tract, kidneys, bladder, extremities.  Heart: Normal 4 chamber, LVOT and RVOT views.  Other views suboptimal.    ____________________________________________________________________________  Maternal Structures:  Cervical length 30 mm.  ____________________________________________________________________________  Report Summary:  Impression: Single intrauterine pregnancy with an estimated gestational age of  [redacted] weeks 4 day(s).  Dating assigned by LMP concordant with today's  ultrasound.    Due to fetal position, short axis views of the great vessels was  suboptimal; 4 chamber and outflow tracts appear normal.  Other fetal anatomy  visualized appears normal. CODING DESCRIPTION:  Maternal proteinuria.   Recommendations: Recommend follow up ultrasound at about 28 weeks to assess  growth.    Thank you for allowing [place] to participate in her care.  Electronically signed by:  Wynona Neat, MD     Additional Lab/Radiology Notes Nephrology ordered (per their consult): ANA, ANCA, anti-GBM, serum complements, hepatitis panel, RPR and serum and urine electrophoresis She is scheduled for a f/up  consult on 04/25/2012 and renal US on 04/15/2012.   Impression/Recommendations:   Impression 24 yo with history of proteinuria of uncertain etiology.  BP remains in the normal range.    Recommendations 1.  f/up with Nephrology to determine if renal biopsy is indicated. 2.  Monitor for s/sx of preeclampsia as proteinuria does somewhat increase her risk. 3.  f/up US for growth is scheduled at about 28 weeks per prior consult (follow up growth at 26-28 weeks and 32-34 weeks, antenatal testing weekly at 36 weeks or sooner if clinically indicated)     Total Time Spent with Patient 10 minutes    >50% of visit spent in couseling/coordination of care yes    Office Use Only [id number]  Office Visit Level 2 (66min) EST prob focused office/outpt   Coding Description: MATERNAL CONDITIONS/HISTORY INDICATION(S).   OTHER: Proteinuria.  Electronic Signatures: Wynona Neat (MD)  (Signed 27-Jan-14 14:37)  Authored: Referral, Home Medications, Allergies, Vital Signs/Notes, Consult, Radiology, Lab/Radiology Notes, Impression, Billing, Coding Description   Last Updated: 27-Jan-14 14:37 by Wynona Neat (MD)

## 2014-07-14 ENCOUNTER — Encounter: Payer: Self-pay | Admitting: Emergency Medicine

## 2014-07-14 ENCOUNTER — Ambulatory Visit
Admission: EM | Admit: 2014-07-14 | Discharge: 2014-07-14 | Disposition: A | Discharge status: Home / Self Care | Payer: 59 | Attending: Registered Nurse | Admitting: Registered Nurse

## 2014-07-14 DIAGNOSIS — A084 Viral intestinal infection, unspecified: Secondary | ICD-10-CM

## 2014-07-14 DIAGNOSIS — R110 Nausea: Secondary | ICD-10-CM | POA: Diagnosis not present

## 2014-07-14 DIAGNOSIS — B349 Viral infection, unspecified: Secondary | ICD-10-CM | POA: Diagnosis not present

## 2014-07-14 DIAGNOSIS — R1110 Vomiting, unspecified: Secondary | ICD-10-CM | POA: Diagnosis not present

## 2014-07-14 DIAGNOSIS — E860 Dehydration: Secondary | ICD-10-CM

## 2014-07-14 DIAGNOSIS — R109 Unspecified abdominal pain: Secondary | ICD-10-CM | POA: Diagnosis present

## 2014-07-14 DIAGNOSIS — Z79899 Other long term (current) drug therapy: Secondary | ICD-10-CM | POA: Insufficient documentation

## 2014-07-14 LAB — COMPREHENSIVE METABOLIC PANEL
ALT: 14 U/L (ref 14–54)
ANION GAP: 9 (ref 5–15)
AST: 16 U/L (ref 15–41)
Albumin: 3.9 g/dL (ref 3.5–5.0)
Alkaline Phosphatase: 94 U/L (ref 38–126)
BILIRUBIN TOTAL: 0.5 mg/dL (ref 0.3–1.2)
BUN: 17 mg/dL (ref 6–20)
CALCIUM: 9.5 mg/dL (ref 8.9–10.3)
CO2: 25 mmol/L (ref 22–32)
Chloride: 107 mmol/L (ref 101–111)
Creatinine, Ser: 0.51 mg/dL (ref 0.44–1.00)
GFR calc Af Amer: >60 mL/min (ref >60)
GLUCOSE: 100 mg/dL — AB (ref 65–99)
Potassium: 3.7 mmol/L (ref 3.5–5.1)
SODIUM: 141 mmol/L (ref 135–145)
Total Protein: 7.5 g/dL (ref 6.5–8.1)

## 2014-07-14 LAB — URINALYSIS COMPLETE WITH MICROSCOPIC (ARMC ONLY)
Bacteria, UA: NONE SEEN — AB
Glucose, UA: NEGATIVE mg/dL
Leukocytes, UA: NEGATIVE
NITRITE: NEGATIVE
PH: 7 (ref 5.0–8.0)
Protein, ur: >300 mg/dL — AB
Specific Gravity, Urine: 1.03 (ref 1.005–1.030)

## 2014-07-14 LAB — CBC WITH DIFFERENTIAL/PLATELET
BASOS PCT: 1 %
Basophils Absolute: 0.1 10*3/uL (ref 0–0.1)
EOS ABS: 0.3 10*3/uL (ref 0–0.7)
Eosinophils Relative: 4 %
HEMATOCRIT: 40.5 % (ref 35.0–47.0)
HEMOGLOBIN: 13.5 g/dL (ref 12.0–16.0)
Lymphocytes Relative: 29 %
Lymphs Abs: 2.2 10*3/uL (ref 1.0–3.6)
MCH: 29.4 pg (ref 26.0–34.0)
MCHC: 33.5 g/dL (ref 32.0–36.0)
MCV: 87.9 fL (ref 80.0–100.0)
MONO ABS: 0.5 10*3/uL (ref 0.2–0.9)
MONOS PCT: 7 %
NEUTROS PCT: 59 %
Neutro Abs: 4.5 10*3/uL (ref 1.4–6.5)
Platelets: 376 10*3/uL (ref 150–440)
RBC: 4.6 MIL/uL (ref 3.80–5.20)
RDW: 13 % (ref 11.5–14.5)
WBC: 7.6 10*3/uL (ref 3.6–11.0)

## 2014-07-14 LAB — LIPASE, BLOOD: LIPASE: 33 U/L (ref 22–51)

## 2014-07-14 LAB — AMYLASE: Amylase: 28 U/L (ref 28–100)

## 2014-07-14 LAB — HCG, QUANTITATIVE, PREGNANCY: hCG, Beta Chain, Quant, S: <1 m[IU]/mL (ref <5)

## 2014-07-14 LAB — RAPID STREP SCREEN (MED CTR MEBANE ONLY): STREPTOCOCCUS, GROUP A SCREEN (DIRECT): NEGATIVE

## 2014-07-14 MED ORDER — ONDANSETRON 8 MG PO TBDP
8.0000 mg | ORAL_TABLET | Freq: Once | ORAL | Status: AC
  Administered 2014-07-14: 8 mg via ORAL

## 2014-07-14 MED ORDER — SODIUM CHLORIDE 0.9 % IV BOLUS (SEPSIS)
1000.0000 mL | Freq: Once | INTRAVENOUS | Status: AC
  Administered 2014-07-14: 1000 mL via INTRAVENOUS

## 2014-07-14 MED ORDER — ONDANSETRON 8 MG PO TBDP: 8.0000 mg | ORAL_TABLET | Freq: Two times a day (BID) | ORAL | Status: AC

## 2014-07-14 NOTE — ED Provider Notes (Signed)
CSN: [id number]     Arrival date & time 07/14/14  0715 History   None    Chief Complaint  Patient presents with  . Abdominal Pain  . Nausea  . Emesis   (Consider location/radiation/quality/duration/timing/severity/associated sxs/prior Treatment) The history is provided by the patient and a parent.  24y/o african [place] female with 2 week history nausea/vomiting/intermittent sharp low abdominal pain.  Decreased appetite, random sometimes am, others after lunch or bedtime, throat pain x 2 days, nasal congestion past month; typically throws up after solids rice two bites yesterday and then po fluids only ginger ale kept down from 2300-0300 today not a whole can.  Was seen at public health yesterday told she did not have bacterial vaginosis, STD or pregnancy.  Has taken zofran and phenergan in past without problems.  No meds at home right now.   History reviewed. No pertinent past medical history. Past Surgical History  Procedure Laterality Date  . Umbilical hernia repair    . Cesarean section     History reviewed. No pertinent family history. History  Substance Use Topics  . Smoking status: Never Smoker   . Smokeless tobacco: Never Used  . Alcohol Use: Yes  works at [employer] [place] OB History    No data available     Review of Systems  Constitutional: Positive for appetite change.  HENT: Positive for congestion, rhinorrhea and sore throat.   Eyes: Negative.   Respiratory: Negative.   Cardiovascular: Negative.   Gastrointestinal: Positive for nausea, vomiting and abdominal pain.  Endocrine: Negative for cold intolerance and heat intolerance.  Genitourinary: Negative.   Musculoskeletal: Negative.   Skin: Negative.   Allergic/Immunologic: Positive for environmental allergies.  Neurological: Positive for headaches.  Hematological: Negative.   Psychiatric/Behavioral: Negative.     Allergies  Review of patient's allergies indicates no  known allergies.  Home Medications   Prior to Admission medications   Medication Sig Start Date End Date Taking? Authorizing Provider  ondansetron (ZOFRAN-ODT) 8 MG disintegrating tablet Take 1 tablet (8 mg total) by mouth 2 (two) times daily. 07/14/14 07/16/14  Jarold Songina A Betancourt, NP   BP 125/79 mmHg  Pulse 87  Temp(Src) 98.4 F (36.9 C) (Tympanic)  Resp 16  Ht 5\' 3"  (1.6 m)  Wt 180 lb (81.647 kg)  BMI 31.89 kg/m2  SpO2 97%  LMP  (LMP Unknown) Physical Exam  Constitutional: She is oriented to person, place, and time. Vital signs are normal. She appears well-developed and well-nourished. No distress.  HENT:  Head: Normocephalic and atraumatic.  Right Ear: Hearing, tympanic membrane, external ear and ear canal normal. Tympanic membrane is not erythematous.  Left Ear: Hearing, tympanic membrane, external ear and ear canal normal. Tympanic membrane is not erythematous.  Mouth/Throat: Uvula is midline and mucous membranes are normal. No oral lesions. No trismus in the jaw. No uvula swelling. Posterior oropharyngeal edema and posterior oropharyngeal erythema present. No oropharyngeal exudate or tonsillar abscesses.  Bilateral tonsils with edema/erythema 2-3+/4  Eyes: Conjunctivae, EOM and lids are normal. Pupils are equal, round, and reactive to light.  Neck: Trachea normal and normal range of motion. Neck supple.  Cardiovascular: Normal rate, regular rhythm, normal heart sounds and intact distal pulses.   Pulmonary/Chest: Effort normal and breath sounds normal.  Abdominal: Soft. Normal appearance and bowel sounds are normal. She exhibits no shifting dullness, no distension, no fluid wave, no abdominal bruit, no ascites and no mass. There is no hepatosplenomegaly. There is tenderness in  the epigastric area, periumbilical area and left lower quadrant. There is no rigidity, no rebound, no guarding, no CVA tenderness, no tenderness at McBurney's point and negative Murphy's sign. No hernia. Hernia  confirmed negative in the ventral area.  tympanny to percussion x 4 quads; normoactive bowel sounds x 4 quads  Musculoskeletal: Normal range of motion.  Neurological: She is alert and oriented to person, place, and time.  Skin: Skin is warm, dry and intact. She is not diaphoretic.  Psychiatric: She has a normal mood and affect. Her speech is normal and behavior is normal. Judgment and thought content normal. Cognition and memory are normal.    ED Course  Procedures (including critical care time) Labs Review Labs Reviewed  URINALYSIS COMPLETEWITH MICROSCOPIC [facility])  - Abnormal; Notable for the following:    APPearance HAZY (*)    Bilirubin Urine 1+ (*)    Ketones, ur TRACE (*)    Hgb urine dipstick 1+ (*)    Protein, ur >300 (*)    Bacteria, UA NONE SEEN (*)    Squamous Epithelial / LPF 6-30 (*)    All other components within normal limits  COMPREHENSIVE METABOLIC PANEL - Abnormal; Notable for the following:    Glucose, Bld 100 (*)    All other components within normal limits  RAPID STREP SCREEN  CULTURE, GROUP A STREP ([facility])  HCG, QUANTITATIVE, PREGNANCY  CBC WITH DIFFERENTIAL/PLATELET  AMYLASE  LIPASE, BLOOD   0900 patient notified rapid strep negative and urinalysis with protein, mucous, ketones, probable vaginal discharge.  Denied discharge on underwear.  Patient reported after taking zofran she threw up and has been unable to tolerate po intake water.  Resting on exam table with mother and son in room.  Discussed orthostatics would be performed.  Discussed if nausea/vomiting hold po intake x 1 hour then sips of clear liquid next 8 hours and if tolerating without nausea/vomiting may try bland diet.  Avoid fried, spicy, dairy, large portions of meat until asymptomatic and appetite has returned to normal. 0920  Orthostatics completed by RN Heather borderline.  Labs ordered along with NS IV bolus.  Patient notified, verbalized understanding of information/agreed with plan of  care. 1020 patient notified CBC, lipase, amylase, cmp normal.  Discussed with patient suspect viral illness/gastroenteritis with dehydration.  Continue IV bolus and will send Rx for patient preferred antiemetic zofran ODT 8mg  po BID prn nausea/vomiting to pharmacy.  Patient reported lightheadedness has resolved after 500ml infusion NS--reading smart phone on exam table still having nausea but no vomiting and has not tried sipping any water.  Discussed OTC generic claritin or zyrtec 10mg  po daily with or without sudafed 30mg  po q4-6h prn rhinitis max 8 tabs/240mg  in 24 hours.  Patient verbalized understanding of information/instructions, agreed with plan of care and had no further questions at this time. 1100 Patient ambulated to bathroom without difficulty 1 liter NS bolus completed; urine decreased concentration per patient less yellow than at home.  Ready to go home, feeling better.  RN Herbert SetaHeather notified to discontinue IV and patient's mother to drive her home.  Patient verbalized understanding of information/instructions, agreed with plan of care and had no further questions at this time.  Imaging Review No results found.   MDM   1. Viral illness   2. Dehydration, mild   3. Gastroenteritis and colitis, viral        Barbaraann Barthelina A Betancourt, NP 07/14/14 1107  Barbaraann Barthelina A Betancourt, NP 07/14/14 1109

## 2014-07-14 NOTE — ED Notes (Signed)
Patient was able to void after of IV fluids.  Patient denies any pain. Patient denies nausea.  Patient is resting comfortably in her room.

## 2014-07-14 NOTE — ED Notes (Signed)
Orthostatic BP Lying BP 113/73 P 71 Sitting BP 121/80 P 79 Standing BP 100/69 P 85

## 2014-07-14 NOTE — Discharge Instructions (Signed)
Gastritis, Adult Gastritis is soreness and swelling (inflammation) of the lining of the stomach. Gastritis can develop as a sudden onset (acute) or long-term (chronic) condition. If gastritis is not treated, it can lead to stomach bleeding and ulcers. CAUSES  Gastritis occurs when the stomach lining is weak or damaged. Digestive juices from the stomach then inflame the weakened stomach lining. The stomach lining may be weak or damaged due to viral or bacterial infections. One common bacterial infection is the Helicobacter pylori infection. Gastritis can also result from excessive alcohol consumption, taking certain medicines, or having too much acid in the stomach.  SYMPTOMS  In some cases, there are no symptoms. When symptoms are present, they may include:  Pain or a burning sensation in the upper abdomen.  Nausea.  Vomiting.  An uncomfortable feeling of fullness after eating. DIAGNOSIS  Your caregiver may suspect you have gastritis based on your symptoms and a physical exam. To determine the cause of your gastritis, your caregiver may perform the following:  Blood or stool tests to check for the H pylori bacterium.  Gastroscopy. A thin, flexible tube (endoscope) is passed down the esophagus and into the stomach. The endoscope has a light and camera on the end. Your caregiver uses the endoscope to view the inside of the stomach.  Taking a tissue sample (biopsy) from the stomach to examine under a microscope. TREATMENT  Depending on the cause of your gastritis, medicines may be prescribed. If you have a bacterial infection, such as an H pylori infection, antibiotics may be given. If your gastritis is caused by too much acid in the stomach, H2 blockers or antacids may be given. Your caregiver may recommend that you stop taking aspirin, ibuprofen, or other nonsteroidal anti-inflammatory drugs (NSAIDs). HOME CARE INSTRUCTIONS  Only take over-the-counter or prescription medicines as directed by  your caregiver.  If you were given antibiotic medicines, take them as directed. Finish them even if you start to feel better.  Drink enough fluids to keep your urine clear or pale yellow.  Avoid foods and drinks that make your symptoms worse, such as:  Caffeine or alcoholic drinks.  Chocolate.  Peppermint or mint flavorings.  Garlic and onions.  Spicy foods.  Citrus fruits, such as oranges, lemons, or limes.  Tomato-based foods such as sauce, chili, salsa, and pizza.  Fried and fatty foods.  Eat small, frequent meals instead of large meals. SEEK IMMEDIATE MEDICAL CARE IF:   You have black or dark red stools.  You vomit blood or material that looks like coffee grounds.  You are unable to keep fluids down.  Your abdominal pain gets worse.  You have a fever.  You do not feel better after 1 week.  You have any other questions or concerns. MAKE SURE YOU:  Understand these instructions.  Will watch your condition.  Will get help right away if you are not doing well or get worse. Document Released: 02/18/2001 Document Revised: 08/26/2011 Document Reviewed: 04/09/2011 [facility] Patient Information 2015 [place]. This information is not intended to replace advice given to you by your health care provider. Make sure you discuss any questions you have with your health care provider. Viral Infections A virus is a type of germ. Viruses can cause:  Minor sore throats.  Aches and pains.  Headaches.  Runny nose.  Rashes.  Watery eyes.  Tiredness.  Coughs.  Loss of appetite.  Feeling sick to your stomach (nausea).  Throwing up (vomiting).  Watery poop (diarrhea). HOME CARE  Only take medicines as told by your doctor.  Drink enough water and fluids to keep your pee (urine) clear or pale yellow. Sports drinks are a good choice.  Get plenty of rest and eat healthy. Soups and broths with crackers or rice are fine. GET HELP RIGHT AWAY IF:   You  have a very bad headache.  You have shortness of breath.  You have chest pain or neck pain.  You have an unusual rash.  You cannot stop throwing up.  You have watery poop that does not stop.  You cannot keep fluids down.  You or your child has a temperature by mouth above 102 F (38.9 C), not controlled by medicine.  Your baby is older than 3 months with a rectal temperature of 102 F (38.9 C) or higher.  Your baby is 13 months old or younger with a rectal temperature of 100.4 F (38 C) or higher. MAKE SURE YOU:   Understand these instructions.  Will watch this condition.  Will get help right away if you are not doing well or get worse. Document Released: 02/07/2008 Document Revised: 05/19/2011 Document Reviewed: 07/02/2010 [facility] Patient Information 2015 [place], [place]. This information is not intended to replace advice given to you by your health care provider. Make sure you discuss any questions you have with your health care provider.

## 2014-07-14 NOTE — ED Notes (Signed)
Patient c/o N/V and abdominal pain for two weeks.  Patient states that [facility] was [facility] and had a negative pregnancy test.  Patient denies fevers.

## 2014-07-17 LAB — CULTURE, GROUP A STREP (THRC)

## 2014-07-18 NOTE — H&P (Signed)
L&D Evaluation:  History:  HPI 24 y/[facility]   Presents with nausea/vomiting, with blood in it last p.m.   Patient's Medical History No Chronic Illness  Fe infusion this pregnancy due to severe anemia; also followed by "kidney specialist" due to proteinuria.  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  HEENT, CNS, GI, GU, Respiratory, CV, Renal and Musculoskeletal systems were found to be normal., denies fever, diarrhea   Exam:  Vital Signs stable   General no apparent distress   Chest clear   Abdomen gravid, non-tender   Estimated Fetal Weight Average for gestational age   Back no CVAT   FHT present   Skin dry   Impression:  Impression Hyperemesis/hematemesis   Plan:  Plan fluids   Comments antiemetics recheck Hgb in a.m. -- if significantly down, consult GI Sips in a.m.; NPO til then check thyroid fxn   Electronic Signatures: Margaretha GlassingEvans, Ricky L (MD)  (Signed 15-Apr-14 17:08)  Authored: L&D Evaluation   Last Updated: 15-Apr-14 17:08 by Margaretha GlassingEvans, Ricky L (MD)

## 2014-07-18 NOTE — H&P (Signed)
L&D Evaluation:  History:  HPI 24 y/o G1 @ 39wks EDC 09/10/11 here for IOL due to elevated proteinuria during pregnancy. Nephrology consult - auto immune work up negative, unsure preexisting renal disease vs pregnancy induced renal disease. 4 week consult planned. Severe iron deficianey anemia transfusion x 1 unit PRBC during pregnancy. GBS negative   Presents with IOL   Patient's Medical History above   Patient's Surgical History none   Medications Pre Natal Vitamins  Iron   Allergies NKDA   Social History none   Family History Non-Contributory   ROS:  ROS All systems were reviewed.  HEENT, CNS, GI, GU, Respiratory, CV, Renal and Musculoskeletal systems were found to be normal.   Exam:  Vital Signs stable   Urine Protein not completed   General no apparent distress   Mental Status clear   Chest clear   Heart normal sinus rhythm   Abdomen gravid, non-tender   Estimated Fetal Weight Average for gestational age   Fetal Position vtx   Fundal Height term   Back no CVAT   Edema no edema   Reflexes 1+   Clonus negative   Pelvic no external lesions, cx posterior fingertip BOW no show Cervidil removed   Mebranes Intact   FHT normal rate with no decels, baseline 130's 140's avg variability with accels   FHT Description 136   Ucx irregular, mild occasional   Skin dry   Lymph no lymphadenopathy   Impression:  Impression IOL at term renal disease   Plan:  Plan EFM/NST, monitor contractions and for cervical change   Comments Admitted last night for cervidil, removed this am. DC plan of care and what to expect IOL first baby. Questions answered. Plan epidural with labor progress. FOB supportive at bedside.   Electronic Signatures: Albertina ParrLugiano, Daymeon Fischman B (CNM)  (Signed 27-Jun-14 08:48)  Authored: L&D Evaluation   Last Updated: 27-Jun-14 08:48 by Albertina ParrLugiano, Amelio Brosky B (CNM)

## 2015-08-29 ENCOUNTER — Ambulatory Visit (HOSPITAL_COMMUNITY)
Admission: EM | Admit: 2015-08-29 | Discharge: 2015-08-29 | Disposition: A | Discharge status: Home / Self Care | Payer: No Typology Code available for payment source | Source: Ambulatory Visit | Attending: Emergency Medicine | Admitting: Emergency Medicine

## 2015-08-29 ENCOUNTER — Encounter: Payer: Self-pay | Admitting: Emergency Medicine

## 2015-08-29 ENCOUNTER — Emergency Department
Admission: EM | Admit: 2015-08-29 | Discharge: 2015-08-31 | Disposition: A | Discharge status: Home / Self Care | Payer: 59 | Attending: Emergency Medicine | Admitting: Emergency Medicine

## 2015-08-29 DIAGNOSIS — Z0441 Encounter for examination and observation following alleged adult rape: Secondary | ICD-10-CM | POA: Insufficient documentation

## 2015-08-29 DIAGNOSIS — T7421XA Adult sexual abuse, confirmed, initial encounter: Secondary | ICD-10-CM | POA: Diagnosis not present

## 2015-08-29 DIAGNOSIS — R51 Headache: Secondary | ICD-10-CM | POA: Diagnosis not present

## 2015-08-29 MED ORDER — PROMETHAZINE HCL 25 MG PO TABS
25.0000 mg | ORAL_TABLET | Freq: Four times a day (QID) | ORAL | Status: DC | PRN
  Filled 2015-08-29: qty 1

## 2015-08-29 MED ORDER — IBUPROFEN 800 MG PO TABS
800.0000 mg | ORAL_TABLET | Freq: Once | ORAL | Status: AC
  Administered 2015-08-29: 800 mg via ORAL
  Filled 2015-08-29: qty 1

## 2015-08-29 MED ORDER — METRONIDAZOLE 500 MG PO TABS
2000.0000 mg | ORAL_TABLET | Freq: Once | ORAL | Status: DC
  Filled 2015-08-29: qty 4

## 2015-08-29 MED ORDER — ULIPRISTAL ACETATE 30 MG PO TABS
30.0000 mg | ORAL_TABLET | Freq: Once | ORAL | Status: DC
  Filled 2015-08-29: qty 1

## 2015-08-29 MED ORDER — CEFTRIAXONE SODIUM 250 MG IJ SOLR
250.0000 mg | Freq: Once | INTRAMUSCULAR | Status: AC
  Administered 2015-08-29: 250 mg via INTRAMUSCULAR
  Filled 2015-08-29: qty 250

## 2015-08-29 MED ORDER — AZITHROMYCIN 500 MG PO TABS
1000.0000 mg | ORAL_TABLET | Freq: Once | ORAL | Status: AC
  Administered 2015-08-29: 1000 mg via ORAL
  Filled 2015-08-29: qty 2

## 2015-08-29 MED ORDER — LIDOCAINE HCL (PF) 1 % IJ SOLN
0.9000 mL | Freq: Once | INTRAMUSCULAR | Status: AC
  Administered 2015-08-29: 0.9 mL
  Filled 2015-08-29: qty 5
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
## 2015-09-07 NOTE — SANE Note (Signed)
Chart requested on 09/05/15 by [facility]. ROI verified. Chart sent via SDFI on 09/07/15.

## 2015-09-18 ENCOUNTER — Encounter: Payer: 59 | Admitting: Advanced Practice Midwife

## 2016-04-10 DIAGNOSIS — Z3202 Encounter for pregnancy test, result negative: Secondary | ICD-10-CM | POA: Diagnosis not present

## 2016-04-10 DIAGNOSIS — Z3046 Encounter for surveillance of implantable subdermal contraceptive: Secondary | ICD-10-CM | POA: Diagnosis not present

## 2016-04-10 DIAGNOSIS — Z30017 Encounter for initial prescription of implantable subdermal contraceptive: Secondary | ICD-10-CM | POA: Diagnosis not present

## 2016-07-17 ENCOUNTER — Inpatient Hospital Stay
Admit: 2016-07-17 | Discharge: 2016-07-17 | Disposition: A | Discharge status: Home / Self Care | Payer: PRIVATE HEALTH INSURANCE | Attending: Emergency Medicine

## 2016-07-17 DIAGNOSIS — N760 Acute vaginitis: Secondary | ICD-10-CM

## 2016-07-17 DIAGNOSIS — B9689 Other specified bacterial agents as the cause of diseases classified elsewhere: Secondary | ICD-10-CM | POA: Diagnosis not present

## 2016-07-17 LAB — BASIC METABOLIC PANEL
Anion Gap: 16 mmol/L (ref 7–16)
BUN: 11 mg/dL (ref 6–20)
CO2: 23 mmol/L (ref 22–29)
Calcium: 9.8 mg/dL (ref 8.6–10.2)
Chloride: 102 mmol/L (ref 98–107)
Creatinine: 0.6 mg/dL (ref 0.5–1.0)
GFR African American: >60
GFR Non-African American: >60 mL/min/{1.73_m2} (ref >=60)
Glucose: 99 mg/dL (ref 74–109)
Potassium: 3.8 mmol/L (ref 3.5–5.0)
Sodium: 141 mmol/L (ref 132–146)

## 2016-07-17 LAB — MICROSCOPIC URINALYSIS

## 2016-07-17 LAB — CBC WITH AUTO DIFFERENTIAL
Basophils %: 0.3 % (ref 0.0–2.0)
Basophils Absolute: 0.02 E9/L (ref 0.00–0.20)
Eosinophils %: 2.9 % (ref 0.0–6.0)
Eosinophils Absolute: 0.17 E9/L (ref 0.05–0.50)
Hematocrit: 41.9 % (ref 34.0–48.0)
Hemoglobin: 13.8 g/dL (ref 11.5–15.5)
Immature Granulocytes #: 0.01 E9/L
Immature Granulocytes %: 0.2 % (ref 0.0–5.0)
Lymphocytes %: 22.1 % (ref 20.0–42.0)
Lymphocytes Absolute: 1.31 E9/L — ABNORMAL LOW (ref 1.50–4.00)
MCH: 29.7 pg (ref 26.0–35.0)
MCHC: 32.9 % (ref 32.0–34.5)
MCV: 90.1 fL (ref 80.0–99.9)
MPV: 8.9 fL (ref 7.0–12.0)
Monocytes %: 7.4 % (ref 2.0–12.0)
Monocytes Absolute: 0.44 E9/L (ref 0.10–0.95)
Neutrophils %: 67.1 % (ref 43.0–80.0)
Neutrophils Absolute: 3.99 E9/L (ref 1.80–7.30)
Platelets: 329 E9/L (ref 130–450)
RBC: 4.65 E12/L (ref 3.50–5.50)
RDW: 12.7 fL (ref 11.5–15.0)
WBC: 5.9 E9/L (ref 4.5–11.5)

## 2016-07-17 LAB — URINALYSIS
Bilirubin Urine: NEGATIVE
Glucose, Ur: NEGATIVE mg/dL
Ketones, Urine: NEGATIVE mg/dL
Leukocyte Esterase, Urine: NEGATIVE
Nitrite, Urine: NEGATIVE
Protein, UA: 100 mg/dL — AB
Specific Gravity, UA: >=1.03 (ref 1.005–1.030)
Urobilinogen, Urine: 1 E.U./dL (ref <2.0)
pH, UA: 7 (ref 5.0–9.0)

## 2016-07-17 LAB — POC PREGNANCY UR-QUAL: Preg Test, Ur: NEGATIVE

## 2016-07-17 LAB — WET PREP, GENITAL
Trichomonas Prep: NONE SEEN
Yeast, Wet Prep: NONE SEEN

## 2016-07-17 MED ORDER — METRONIDAZOLE 500 MG PO TABS
500 MG | ORAL_TABLET | Freq: Two times a day (BID) | ORAL | 0 refills | Status: AC
Start: 2016-07-17 — End: 2016-07-24

## 2016-07-17 MED ORDER — ONDANSETRON 4 MG PO TBDP
4 MG | ORAL_TABLET | Freq: Three times a day (TID) | ORAL | 0 refills | Status: DC | PRN
Start: 2016-07-17 — End: 2019-12-30

## 2016-07-17 NOTE — ED Provider Notes (Signed)
[facility]      Department of Emergency Medicine   ED  Provider Note  Admit Date/Time: 07/17/2016  1:04 AM  ED Bed: 17/17  MRN: [id number]  Chief Complaint       Abdominal Pain and Emesis    History of Present Illness   Source of history provided by:  patient.  History/Exam Limitations: none.      Judith Bryant is a 26 y.o. old female who has a past medical history of: History reviewed. No pertinent past medical history. presents to the emergency department by private vehicle, for complaints of gradual onset, intermittent episodes colicky, cramping pain suprapubic area without radiation which began 1 day(s) prior to arrival. Prior to this starting pt has been having nausea and vomiting x2 weeks prior to this.  There has been no similar episodes in the past.  Since onset the symptoms have been intermittent and persistent.  The pain is associated with nothing pertinent.  The pain is aggravated by none and relieved by nothing.  There has been NO back pain, chills, cloudy urine, constipation, diarrhea, dysuria, hematuria, vaginal discharge, vaginal itching, vomiting or fever.     GYN History: regular.  STD History: no history of PID, STD's.  Patient's last menstrual period was 12/26/2015.  Current Pregnancy: No.     Birth Control: Implanon.    Gravid Status: No obstetric history on file.    .  ROS   Pertinent positives and negatives are stated within HPI, all other systems reviewed and are negative.    Past Surgical History:   Procedure Laterality Date   . CESAREAN SECTION     Social History:  reports that she has been smoking.  She has never used smokeless tobacco. She reports that she does not drink alcohol or use drugs.  Family History: family history is not on file.   Allergies: Patient has no known allergies.    Physical Exam           ED Triage Vitals [07/17/16 0119]   BP Temp Temp Source Pulse Resp SpO2 Height Weight   120/77 98.3 F (36.8 C) Oral 75 16 98 % 5\' 4"  (1.626 m) 180 lb (81.6 kg)      Oxygen  Saturation Interpretation: Normal.     General Appearance/Constitutional:  Alert, development consistent with age.   HEENT:  NC/NT. PERRLA.  Airway patent.   Neck:  Supple.  No lymphadenopathy.   Respiratory:  No retractions.  Lungs Clear to auscultation and breath sounds equal.   CV:  Regular rate and rhythm.   GI:  General Appearance: normal.         Bowel sounds: normal bowel sounds.           Distension:  None.            Tenderness: No abdominal tenderness.           Liver: non-tender.                         Spleen:  non-tender.         Pulsatile Mass: absent.           Hernia:  no inguinal or femoral hernias noted.   Back: CVA Tenderness: No.   GU: (chaperone present during examination).         External Genitalia: General appearance; normal, Hair distribution; normal, Lesions absent.         Urethral Meatus: Size normal, Location normal,  Lesions absent, Prolapse absent.          Vagina: no abnormal discharge or lesions.         Cervix: cervical motion tenderness absent.         Uterus:  normal size, contour, position, consistency, mobility, mild tenderness         Adenexa: no tenderness bilaterally.         Anus/Perineum:  normal.   Integument:  Normal turgor.  Warm, dry, without visible rash, unless noted elsewhere.   Lymphatics: No edema, cap.refill <3sec.   Neurological:  Orientation age-appropriate.  Motor functions intact.    Lab / Imaging Results   (All laboratory and radiology results have been personally reviewed by myself)  Labs:  Results for orders placed or performed during the hospital encounter of 07/17/16   Wet prep, genital   Result Value Ref Range    Trichomonas Prep None Seen     Yeast, Wet Prep None Seen     Clue Cells, Wet Prep 1+ (A)     Source Wet Prep VAGINAL    CBC Auto Differential   Result Value Ref Range    WBC 5.9 4.5 - 11.5 E9/L    RBC 4.65 3.50 - 5.50 E12/L    Hemoglobin 13.8 11.5 - 15.5 g/dL    Hematocrit 84.641.9 96.234.0 - 48.0 %    MCV 90.1 80.0 - 99.9 fL    MCH 29.7 26.0  - 35.0 pg    MCHC 32.9 32.0 - 34.5 %    RDW 12.7 11.5 - 15.0 fL    Platelets 329 130 - 450 E9/L    MPV 8.9 7.0 - 12.0 fL    Neutrophils % 67.1 43.0 - 80.0 %    Immature Granulocytes % 0.2 0.0 - 5.0 %    Lymphocytes % 22.1 20.0 - 42.0 %    Monocytes % 7.4 2.0 - 12.0 %    Eosinophils % 2.9 0.0 - 6.0 %    Basophils % 0.3 0.0 - 2.0 %    Neutrophils # 3.99 1.80 - 7.30 E9/L    Immature Granulocytes # 0.01 E9/L    Lymphocytes # 1.31 (L) 1.50 - 4.00 E9/L    Monocytes # 0.44 0.10 - 0.95 E9/L    Eosinophils # 0.17 0.05 - 0.50 E9/L    Basophils # 0.02 0.00 - 0.20 E9/L   Basic Metabolic Panel   Result Value Ref Range    Sodium 141 132 - 146 mmol/L    Potassium 3.8 3.5 - 5.0 mmol/L    Chloride 102 98 - 107 mmol/L    CO2 23 22 - 29 mmol/L    Anion Gap 16 7 - 16 mmol/L    Glucose 99 74 - 109 mg/dL    BUN 11 6 - 20 mg/dL    CREATININE 0.6 0.5 - 1.0 mg/dL    GFR Non-African American >60 >=60 mL/min/1.73    GFR African American >60     Calcium 9.8 8.6 - 10.2 mg/dL   Urinalysis   Result Value Ref Range    Color, UA Yellow Straw/Yellow    Clarity, UA Clear Clear    Glucose, Ur Negative Negative mg/dL    Bilirubin Urine Negative Negative    Ketones, Urine Negative Negative mg/dL    Specific Gravity, UA >=1.030 1.005 - 1.030    Blood, Urine TRACE-LYSED Negative    pH, UA 7.0 5.0 - 9.0    Protein, UA 100 (A) Negative mg/dL  Urobilinogen, Urine 1.0 <2.0 E.U./dL    Nitrite, Urine Negative Negative    Leukocyte Esterase, Urine Negative Negative   Microscopic Urinalysis   Result Value Ref Range    WBC, UA 1-3 0 - 5 /HPF    RBC, UA 1-3 0 - 2 /HPF    Epi Cells FEW /HPF    Bacteria, UA FEW (A) /HPF   POC Pregnancy Urine Qual   Result Value Ref Range    Preg Test, Ur negative     QC OK? yes      Imaging:  All Radiology results interpreted by Radiologist unless otherwise noted.  No orders to display     ED Course / Medical Decision Making   Medications - No data to display     Re-examination:  07/17/16       Time: Pt sleeping  quietly  Consults:   None    Procedures:   none    MDM:   Pt presents with pain around her C-section scar x 1 day, Two-week history of nausea and vomiting.  Patient is on Implanon for birth control. She has only had one pregnancy with no couple occasions. Liver unremarkable abdomen soft and nonsurgical. Pelvic exam unremarkable with mild tenderness of the uterus but no firmness or irregular size noted. No adnexal tenderness present. Cervical motion tenderness absent.  Wet mount positive for bacterial vaginosis. We will send home with antibiotic and nausea medication. Patient is advised to establish with gynecology and primary care.    Counseling:   The emergency provider has spoken with the patient and discussed today's results, in addition to providing specific details for the plan of care and counseling regarding the diagnosis and prognosis.  Questions are answered at this time and they are agreeable with the plan [facility] with primary care and gyn, referral given.].    Assessment      1. Bacterial vaginosis      Plan   Discharge to home  Patient condition is good    New Medications     New Prescriptions    METRONIDAZOLE (FLAGYL) 500 MG TABLET    Take 1 tablet by mouth 2 times daily for 7 days    ONDANSETRON (ZOFRAN ODT) 4 MG DISINTEGRATING TABLET    Take 1 tablet by mouth every 8 hours as needed for Nausea or Vomiting     Electronically signed by Shelly Flatten, PA-C   DD: 07/17/16  **This report was transcribed using voice recognition software. Every effort was made to ensure accuracy; however, inadvertent computerized transcription errors may be present.  END OF ED PROVIDER NOTE       Shelly Flatten, PA-C  07/17/16 0414       Shelly Flatten, PA-C  07/17/16 [phone]

## 2016-07-17 NOTE — ED Notes (Signed)
Assumed care of patient. Pt lying in bed in no apparent distress, resting comfortably. No visitors at bedside.     Bethanne GingerJanice Leon Montoya, RN  07/17/16 0330

## 2016-07-17 NOTE — ED Notes (Signed)
Discharge instructions given and reviewed with patient. RXs given. Instructed to follow up with Dr Fredric DineKoulianos. Questions and concerns addressed. Pt departed ED ambulatory in no apparent distress. Personal belongings taken.     Bethanne GingerJanice Kirandeep Fariss, RN  07/17/16 [phone]

## 2016-07-21 LAB — C. TRACHOMATIS / N. GONORRHOEAE, DNA
C. Trachomatis Amplified: NEGATIVE
N. Gonorrhoeae Amplified: NEGATIVE

## 2016-09-11 DIAGNOSIS — J029 Acute pharyngitis, unspecified: Secondary | ICD-10-CM

## 2016-09-11 DIAGNOSIS — F17200 Nicotine dependence, unspecified, uncomplicated: Secondary | ICD-10-CM | POA: Diagnosis not present

## 2016-09-11 NOTE — ED Provider Notes (Signed)
[facility]      Department of Emergency Medicine   ED  Provider Note  Admit Date/RoomTime: 09/11/2016 10:39 PM  ED Room: 37/37  Chief Complaint   Pharyngitis    History of Present Illness   Source of history provided by:  patient.  History/Exam Limitations: none.     Judith Bryant is a 26 y.o. old female who has a past medical history of: History reviewed. No pertinent past medical history. presents to the emergency department by private vehicle, for bilateral sore throat pain, which occured 1 day(s) prior to arrival. Associated Signs & Symptoms: chills, fever and sore throat Since onset the symptoms have been worsening. She is drinking moderate amounts of fluids.          Exposed To:  Streptococcus: yes.                              Infectious Mononucleosis:  unknown.        Symptoms:  Pain:  Yes.                            Muffled Voice:  No.                            Hoarse:  No.                            Difficulty with Secretions:  No.    Centor Score (MODIFIED) For Strep Pharyngitis:    Age 70-14 Years   no (0)     Age 40>45 Years   NO     Exudate or Swelling on Tonsils   yes (1)     Tender/Swollen Anterior Cervical Nodes   yes (1)     Fever? (T > 38C, 100.37F)   no (0)     Absence of Cough   yes (1)   TOTAL POINTS   3   Score of Zero = Probability of Strep Pharyngitis: 1% - 2.5%.,   No further testing nor antibiotics.  Score of 1 = Probability of Strep Pharyngitis: 5% - 10%.,   No further testing nor antibiotics.  Score of 2 = Probability of Strep Phar: 11% - 17%.   Culture/test all, ATB's only for positive culture results.  Score of 3 = Probability of Strep Phar: 28% - 35%.   Culture/test all, ATB's only for positive culture results  Score of 4 or 5 = Probability of Strep Pharyngitis: 51% - 53%.     Treat empirically with antibiotics.    ROS    Pertinent positives and negatives are stated within HPI, all other systems reviewed and are negative.    Past Surgical History:  has a past surgical history  that includes Cesarean section.  Social History:  reports that she has been smoking.  She has never used smokeless tobacco. She reports that she does not drink alcohol or use drugs.  Family History: family history is not on file.   Allergies: Patient has no known allergies.    Physical Exam           ED Triage Vitals [09/11/16 2237]   BP Temp Temp Source Pulse Resp SpO2 Height Weight   127/70 98.7 F (37.1 C) Oral 110 16 95 % 5\' 3"  (1.6 m) 180 lb (81.6 kg)  Oxygen Saturation Interpretation: Normal.    Constitutional:  Alert, development consistent with age..  Ears:  TMs without perforation, injection, or bulging.  External canals clear without exudate.  Throat: Airway Patent. marked erythema, tonsillar hypertrophy, 3+ and exudates present.       Neck/Lymphatic:  Supple. There is Bilateral  anterior cervical node tenderness.  respiratory:  Clear to auscultation and breath sounds equal.    CV: Regular rate and rhythm, normal heart sounds, without pathological murmurs, ectopy, gallops, or rubs.  GI:  Abdomen Soft, nontender, good bowel sounds.  No firm or pulsatile mass.  Inegument:  No rashes, erythema present.  Neurological:  Oriented.  Motor functions intact.    Lab / Imaging Results   (All laboratory and radiology results have been personally reviewed by myself)  Labs:  No results found for this visit on 09/11/16.  Imaging:  All Radiology results interpreted by Radiologist unless otherwise noted.  No orders to display       ED Course / Medical Decision Making     Medications   amoxicillin-clavulanate (AUGMENTIN) 875-125 MG per tablet 1 tablet (not administered)        Consult(s):   None    Procedure(s):   none    MDM:   Based on high suspicion for Strep as per history/physical findings, Rapid Strep/Throat Culture testing was not done  Pharyngitis is likely to  be Strep.  Antibiotics are indicated at this time based on clinical presentation and physical findings. Not hypoxic, nothing to suggest pneumonia.  Patient is well appearing, non toxic and appropriate for outpatient management.   Plan of Care: Normal progression of disease discussed.  All questions answered.  Follow-up in 5 days, or sooner should symptoms worsen.     Counseling:    The emergency provider has spoken with the patient and discussed today's results, in addition to providing specific details for the plan of care and counseling regarding the diagnosis and prognosis.  Questions are answered at this time and they are agreeable with the plan  .    Assessment     1. Acute pharyngitis, unspecified etiology      Plan   Discharge to home  Patient condition is good    New Medications     New Prescriptions    AMOXICILLIN-CLAVULANATE (AUGMENTIN) 875-125 MG PER TABLET    Take 1 tablet by mouth 2 times daily for 10 days     Electronically signed by Gayla Medicus, PA   DD: 09/11/16  **This report was transcribed using voice recognition software. Every effort was made to ensure accuracy; however, inadvertent computerized transcription errors may be present.  END OF ED PROVIDER NOTE     Gayla Medicus, [place]  09/11/16 2249

## 2016-09-12 ENCOUNTER — Inpatient Hospital Stay
Admit: 2016-09-12 | Discharge: 2016-09-12 | Disposition: A | Discharge status: Home / Self Care | Payer: PRIVATE HEALTH INSURANCE

## 2016-09-12 MED ORDER — AMOXICILLIN-POT CLAVULANATE 875-125 MG PO TABS
875-125 MG | Freq: Once | ORAL | Status: AC
Start: 2016-09-12 — End: 2016-09-11
  Administered 2016-09-12: 03:00:00 1 via ORAL

## 2016-09-12 MED ORDER — AMOXICILLIN-POT CLAVULANATE 875-125 MG PO TABS
875-125 MG | ORAL_TABLET | Freq: Two times a day (BID) | ORAL | 0 refills | Status: AC
Start: 2016-09-12 — End: 2016-09-21

## 2016-09-12 MED FILL — AMOXICILLIN-POT CLAVULANATE 875-125 MG PO TABS: 875-125 MG | ORAL | Qty: 1

## 2016-12-01 DIAGNOSIS — Z7251 High risk heterosexual behavior: Secondary | ICD-10-CM | POA: Diagnosis not present

## 2016-12-01 DIAGNOSIS — Z113 Encounter for screening for infections with a predominantly sexual mode of transmission: Secondary | ICD-10-CM | POA: Diagnosis not present

## 2016-12-01 DIAGNOSIS — Z114 Encounter for screening for human immunodeficiency virus [HIV]: Secondary | ICD-10-CM | POA: Diagnosis not present

## 2016-12-01 DIAGNOSIS — N898 Other specified noninflammatory disorders of vagina: Secondary | ICD-10-CM | POA: Diagnosis not present

## 2017-11-23 DIAGNOSIS — H1031 Unspecified acute conjunctivitis, right eye: Secondary | ICD-10-CM | POA: Diagnosis not present

## 2018-01-29 ENCOUNTER — Emergency Department: Admit: 2018-01-29 | Payer: PRIVATE HEALTH INSURANCE

## 2018-01-29 ENCOUNTER — Inpatient Hospital Stay
Admit: 2018-01-29 | Discharge: 2018-01-29 | Disposition: A | Discharge status: Home / Self Care | Payer: PRIVATE HEALTH INSURANCE

## 2018-01-29 DIAGNOSIS — S0990XA Unspecified injury of head, initial encounter: Secondary | ICD-10-CM

## 2018-01-29 DIAGNOSIS — H538 Other visual disturbances: Secondary | ICD-10-CM | POA: Diagnosis not present

## 2018-01-29 DIAGNOSIS — R51 Headache: Secondary | ICD-10-CM | POA: Diagnosis not present

## 2018-01-29 MED ORDER — ONDANSETRON 4 MG PO TBDP
4 MG | Freq: Once | ORAL | Status: AC
Start: 2018-01-29 — End: 2018-01-29
  Administered 2018-01-29: 18:00:00 4 mg via ORAL

## 2018-01-29 MED ORDER — NAPROXEN 500 MG PO TABS
500 MG | ORAL_TABLET | Freq: Two times a day (BID) | ORAL | 0 refills | Status: DC
Start: 2018-01-29 — End: 2019-12-30

## 2018-01-29 MED ORDER — ACETAMINOPHEN 500 MG PO TABS
500 MG | Freq: Once | ORAL | Status: AC
Start: 2018-01-29 — End: 2018-01-29
  Administered 2018-01-29: 18:00:00 1000 mg via ORAL

## 2018-01-29 MED ORDER — KETOROLAC TROMETHAMINE 30 MG/ML IJ SOLN
30 MG/ML | Freq: Once | INTRAMUSCULAR | Status: AC
Start: 2018-01-29 — End: 2018-01-29
  Administered 2018-01-29: 18:00:00 15 mg via INTRAMUSCULAR

## 2018-01-29 MED ORDER — DIPHENHYDRAMINE HCL 25 MG PO TABS
25 MG | Freq: Once | ORAL | Status: AC
Start: 2018-01-29 — End: 2018-01-29
  Administered 2018-01-29: 18:00:00 25 mg via ORAL

## 2018-01-29 MED FILL — BANOPHEN 25 MG PO TABS: 25 mg | ORAL | Qty: 1

## 2018-01-29 MED FILL — ONDANSETRON 4 MG PO TBDP: 4 mg | ORAL | Qty: 1

## 2018-01-29 MED FILL — KETOROLAC TROMETHAMINE 30 MG/ML IJ SOLN: 30 mg/mL | INTRAMUSCULAR | Qty: 1

## 2018-01-29 MED FILL — ACETAMINOPHEN EXTRA STRENGTH 500 MG PO TABS: 500 mg | ORAL | Qty: 2

## 2018-01-29 NOTE — ED Notes (Signed)
Discharge instructions given. Patient verbalizes understanding. No other noted or stated problems at this time. Patient will follow up with primary care.     Lavetta Nielsenarissa M Pearlene Teat, RN  01/29/18 1423
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
## 2018-04-10 DIAGNOSIS — H5213 Myopia, bilateral: Secondary | ICD-10-CM | POA: Diagnosis not present

## 2019-12-30 ENCOUNTER — Inpatient Hospital Stay
Admit: 2019-12-30 | Discharge: 2019-12-30 | Disposition: A | Discharge status: Home / Self Care | Payer: MEDICAID | Attending: Emergency Medicine

## 2019-12-30 DIAGNOSIS — U071 COVID-19: Secondary | ICD-10-CM

## 2019-12-30 MED ORDER — PSEUDOEPH-BROMPHEN-DM 30-2-10 MG/5ML PO SYRP
2-30-10 | Freq: Four times a day (QID) | ORAL | 0 refills | 7.00000 days | Status: DC | PRN
Start: 2019-12-30 — End: 2024-01-20

## 2019-12-30 NOTE — ED Provider Notes (Signed)
LOSS OF SMELL AND TASTE    The history is provided by the patient.   Illness   The current episode started 3 to 5 days ago. The onset was sudden. The problem is moderate. Associated symptoms include a fever, nausea, congestion, headaches, rhinorrhea, muscle aches and cough. Pertinent negatives include no diarrhea, no vomiting, no ear pain, no sore throat, no wheezing, no rash, no eye discharge, no eye pain and no eye redness.        Review of Systems   Constitutional: Positive for fever. Negative for chills.   HENT: Positive for congestion and rhinorrhea. Negative for ear pain, sinus pressure and sore throat.    Eyes: Negative for pain, discharge and redness.   Respiratory: Positive for cough. Negative for shortness of breath and wheezing.    Cardiovascular: Negative for chest pain.   Gastrointestinal: Positive for nausea. Negative for abdominal distention, diarrhea and vomiting.   Genitourinary: Negative for dysuria and frequency.   Musculoskeletal: Negative for arthralgias and back pain.   Skin: Negative for rash and wound.   Neurological: Positive for headaches. Negative for weakness.   Hematological: Negative for adenopathy.   All other systems reviewed and are negative.       Physical Exam  Vitals and nursing note reviewed.   Constitutional:       Appearance: She is well-developed.   HENT:      Head: Normocephalic and atraumatic.      Right Ear: Tympanic membrane normal.      Left Ear: Tympanic membrane normal.      Nose: Mucosal edema and congestion present.   Eyes:      Pupils: Pupils are equal, round, and reactive to light.   Cardiovascular:      Rate and Rhythm: Normal rate and regular rhythm.      Heart sounds: Normal heart sounds. No murmur heard.     Pulmonary:      Effort: Pulmonary effort is normal. No respiratory distress.      Breath sounds: Normal breath sounds. No wheezing or rales.   Abdominal:      General: Bowel sounds are normal.      Palpations: Abdomen is soft.      Tenderness: There is no  abdominal tenderness. There is no guarding or rebound.   Musculoskeletal:      Cervical back: Normal range of motion and neck supple.   Skin:     General: Skin is warm and dry.   Neurological:      Mental Status: She is alert and oriented to person, place, and time.      Cranial Nerves: No cranial nerve deficit.      Coordination: Coordination normal.          Procedures     MDM          --------------------------------------------- PAST HISTORY ---------------------------------------------  Past Medical History:  has no past medical history on file.    Past Surgical History:  has a past surgical history that includes Cesarean section and Cesarean section.    Social History:  reports that she quit smoking about 2 years ago. She has never used smokeless tobacco. She reports current alcohol use. She reports that she does not use drugs.    Family History: family history is not on file.     The patient???s home medications have been reviewed.    Allergies: Patient has no known allergies.    -------------------------------------------------- RESULTS -------------------------------------------------  Labs:  No results found for  this visit on 12/30/19.    Radiology:  No orders to display       ------------------------- NURSING NOTES AND VITALS REVIEWED ---------------------------  Date / Time Roomed:  12/30/2019 12:38 PM  ED Bed Assignment:  02/02    The nursing notes within the ED encounter and vital signs as below have been reviewed.   BP 122/80    Pulse 84    Temp 97.5 ??F (36.4 ??C) (Temporal)    Resp 16    Ht 5\' 3"  (1.6 m)    Wt 180 lb (81.6 kg)    SpO2 95%    BMI 31.89 kg/m??   Oxygen Saturation Interpretation: Normal      ------------------------------------------ PROGRESS NOTES ------------------------------------------  I have spoken with the patient and discussed today???s results, in addition to providing specific details for the plan of care and counseling regarding the diagnosis and prognosis.  Their questions are  answered at this time and they are agreeable with the plan. I discussed at length with them reasons for immediate return here for re evaluation. They will followup with primary care by calling their office tomorrow.      PCR COVID TEST DONE AS A SEND OUT.    --------------------------------- ADDITIONAL PROVIDER NOTES ---------------------------------  At this time the patient is without objective evidence of an acute process requiring hospitalization or inpatient management.  They have remained hemodynamically stable throughout their entire ED visit and are stable for discharge with outpatient follow-up.     The plan has been discussed in detail and they are aware of the specific conditions for emergent return, as well as the importance of follow-up.      New Prescriptions    BROMPHENIRAMINE-PSEUDOEPHEDRINE-DM 2-30-10 MG/5ML SYRUP    Take 5 mLs by mouth 4 times daily as needed for Congestion or Cough       Diagnosis:  1. Suspected COVID-19 virus infection        Disposition:  Patient's disposition: Discharge to home  Patient's condition is stable.                      08-16-1996, MD  12/30/19 [phone]

## 2019-12-30 NOTE — Discharge Instructions (Signed)
Continuity of Care Form    Patient Name: Judith Bryant   DOB:  [birth year]  MRN:  [id number]    Admit date:  12/30/2019  Discharge date:  ***    Code Status Order: No Order   Advance Directives:     Admitting Physician:  No admitting provider for patient encounter.  PCP: No primary care provider on file.    Discharging Nurse: [facility] Unit/Room#: 02/02  Discharging Unit Phone Number: ***    Emergency Contact:   Extended Emergency Contact Information  Primary Emergency Contact: Judith Bryant  Home Phone: [phone]  Mobile Phone: [phone]  Relation: Parent  Preferred language: English  Interpreter needed? No    Past Surgical History:  Past Surgical History:   Procedure Laterality Date   ??? CESAREAN SECTION     ??? CESAREAN SECTION         Immunization History:     There is no immunization history on file for this patient.    Active Problems:  There is no problem list on file for this patient.      Isolation/Infection:   Isolation          No Isolation        Patient Infection Status     Infection Onset Added Last Indicated Last Indicated By Review Planned Expiration Resolved Resolved By    COVID-19 Rule Out 12/30/19 12/30/19 12/30/19 COVID-19 (Ordered) 01/06/20 01/13/20            Nurse Assessment:  Last Vital Signs: BP 122/80    Pulse 84    Temp 97.5 ??F (36.4 ??C) (Temporal)    Resp 16    Ht 5\' 3"  (1.6 m)    Wt 180 lb (81.6 kg)    SpO2 95%    BMI 31.89 kg/m??     Last documented pain score (0-10 scale): Pain Level: 3  Last Weight:   Wt Readings from Last 1 Encounters:   12/30/19 180 lb (81.6 kg)     Mental Status:  {IP PT MENTAL STATUS:20030}    IV Access:  {MH COC IV ACCESS:304088262}    Nursing Mobility/ADLs:  Walking   {CHP DME 01/01/20  Transfer  {CHP DME QMVH:846962952}  Bathing  {CHP DME WUXL:244010272}  Dressing  {CHP DME ZDGU:440347425}  Toileting  {CHP DME ZDGL:875643329}  Feeding  {CHP DME JJOA:416606301}  Med Admin  {CHP DME SWFU:932355732}  Med Delivery   {MH COC MED  Delivery:304088264}    Wound Care Documentation and Therapy:        Elimination:  Continence:   ?? Bowel: {YES / KGUR:427062376}  ?? Bladder: {YES / EG:31517}  Urinary Catheter: {Urinary Catheter:304088013}   Colostomy/Ileostomy/Ileal Conduit: {YES / OH:60737}       Date of Last BM: ***  No intake or output data in the 24 hours ending 12/30/19 1301  No intake/output data recorded.    Safety Concerns:     {MH COC Safety Concerns:304088272}    Impairments/Disabilities:      {MH COC Impairments/Disabilities:304088273}    Nutrition Therapy:  Current Nutrition Therapy:   {MH COC Diet List:304088271}    Routes of Feeding: {CHP DME Other Feedings:304088042}  Liquids: {Slp liquid thickness:30034}  Daily Fluid Restriction: {CHP DME Yes amt example:304088041}  Last Modified Barium Swallow with Video (Video Swallowing Test): {Done Not Done 01/01/20    Treatments at the Time of Hospital Discharge:   Respiratory Treatments: ***  Oxygen Therapy:  {Therapy; copd oxygen:17808}  Ventilator:    {MH  CC Vent LKGM:010272536}    Rehab Therapies: {THERAPEUTIC INTERVENTION:8487525581}  Weight Bearing Status/Restrictions: {MH CC Weight Bearing:304508812}  Other Medical Equipment (for information only, NOT a DME order):  {EQUIPMENT:304520077}  Other Treatments: ***    Patient's personal belongings (please select all that are sent with patient):  {CHP DME Belongings:304088044}    RN SIGNATURE:  {Esignature:304088025}    CASE MANAGEMENT/SOCIAL WORK SECTION    Inpatient Status Date: ***    Readmission Risk Assessment Score:  Readmission Risk              Risk of Unplanned Readmission:  0           Discharging to Facility/ Agency   ?? Name:   ?? Address:  ?? Phone:  ?? Fax:    Dialysis Facility (if applicable)   ?? Name:  ?? Address:  ?? Dialysis Schedule:  ?? Phone:  ?? Fax:    Case Manager/Social Worker signature: {Esignature:304088025}    PHYSICIAN SECTION    Prognosis: {Prognosis:320-101-9782}    Condition at Discharge: {MH Patient  Condition:304088024}    Rehab Potential (if transferring to Rehab): {Prognosis:320-101-9782}    Recommended Labs or Other Treatments After Discharge: ***    Physician Certification: I certify the above information and transfer of Judith Bryant  is necessary for the continuing treatment of the diagnosis listed and that she requires {Admit to Appropriate Level of Care:20763} for {GREATER/LESS:304500278} 30 days.     Update Admission H&P: {CHP DME Changes in UYQIH:474259563}    PHYSICIAN SIGNATURE:  {Esignature:304088025}

## 2019-12-31 LAB — COVID-19: SARS-CoV-2, PCR: DETECTED — AB

## 2020-01-02 NOTE — Care Coordination-Inpatient (Signed)
-  ACM attempted to reach patient to follow up on recent ED visit on 12-30-2019 for post ED COVID-19 Monitoring, however the mailbox is full. Unable to leave a VM.   -If no return call, will attempt outreach again.

## 2020-01-04 NOTE — Care Coordination-Inpatient (Signed)
ACM's second attempted to reach patient to follow up on recent ED visit for post ED COVID-19 Monitoring, however the recording said the mailbox is full. Unable to leave a VM  -ACM will sign off and resolve COVID 19 monitoring episode due to unable to reach if no return call today.

## 2021-11-08 DIAGNOSIS — Z419 Encounter for procedure for purposes other than remedying health state, unspecified: Secondary | ICD-10-CM | POA: Diagnosis not present

## 2021-12-08 DIAGNOSIS — Z419 Encounter for procedure for purposes other than remedying health state, unspecified: Secondary | ICD-10-CM | POA: Diagnosis not present

## 2022-01-08 DIAGNOSIS — Z419 Encounter for procedure for purposes other than remedying health state, unspecified: Secondary | ICD-10-CM | POA: Diagnosis not present

## 2022-02-07 DIAGNOSIS — Z419 Encounter for procedure for purposes other than remedying health state, unspecified: Secondary | ICD-10-CM | POA: Diagnosis not present

## 2022-03-10 DIAGNOSIS — Z419 Encounter for procedure for purposes other than remedying health state, unspecified: Secondary | ICD-10-CM | POA: Diagnosis not present

## 2022-04-10 DIAGNOSIS — Z419 Encounter for procedure for purposes other than remedying health state, unspecified: Secondary | ICD-10-CM | POA: Diagnosis not present

## 2022-05-09 DIAGNOSIS — Z419 Encounter for procedure for purposes other than remedying health state, unspecified: Secondary | ICD-10-CM | POA: Diagnosis not present

## 2022-06-12 ENCOUNTER — Emergency Department
Admission: EM | Admit: 2022-06-12 | Discharge: 2022-06-13 | Disposition: A | Discharge status: Home / Self Care | Payer: Medicaid Other | Attending: Emergency Medicine | Admitting: Emergency Medicine

## 2022-06-12 ENCOUNTER — Encounter: Payer: Self-pay | Admitting: Emergency Medicine

## 2022-06-12 DIAGNOSIS — O99611 Diseases of the digestive system complicating pregnancy, first trimester: Secondary | ICD-10-CM | POA: Insufficient documentation

## 2022-06-12 DIAGNOSIS — O219 Vomiting of pregnancy, unspecified: Secondary | ICD-10-CM | POA: Diagnosis not present

## 2022-06-12 DIAGNOSIS — Z3A09 9 weeks gestation of pregnancy: Secondary | ICD-10-CM | POA: Diagnosis not present

## 2022-06-12 DIAGNOSIS — K5900 Constipation, unspecified: Secondary | ICD-10-CM

## 2022-06-12 LAB — URINALYSIS, ROUTINE W REFLEX MICROSCOPIC
Bilirubin Urine: NEGATIVE
Glucose, UA: NEGATIVE mg/dL
Hgb urine dipstick: NEGATIVE
Ketones, ur: NEGATIVE mg/dL
Leukocytes,Ua: NEGATIVE
Nitrite: NEGATIVE
Protein, ur: >=300 mg/dL — AB
Specific Gravity, Urine: 1.026 (ref 1.005–1.030)
pH: 6 (ref 5.0–8.0)

## 2022-06-12 LAB — COMPREHENSIVE METABOLIC PANEL
ALT: 61 U/L — ABNORMAL HIGH (ref 0–44)
AST: 37 U/L (ref 15–41)
Albumin: 3.8 g/dL (ref 3.5–5.0)
Alkaline Phosphatase: 47 U/L (ref 38–126)
Anion gap: 10 (ref 5–15)
BUN: 13 mg/dL (ref 6–20)
CO2: 22 mmol/L (ref 22–32)
Calcium: 9.6 mg/dL (ref 8.9–10.3)
Chloride: 102 mmol/L (ref 98–111)
Creatinine, Ser: 0.47 mg/dL (ref 0.44–1.00)
GFR, Estimated: >60 mL/min (ref >60)
Glucose, Bld: 102 mg/dL — ABNORMAL HIGH (ref 70–99)
Potassium: 3.5 mmol/L (ref 3.5–5.1)
Sodium: 134 mmol/L — ABNORMAL LOW (ref 135–145)
Total Bilirubin: 0.6 mg/dL (ref 0.3–1.2)
Total Protein: 7.1 g/dL (ref 6.5–8.1)

## 2022-06-12 LAB — CBC WITH DIFFERENTIAL/PLATELET
Abs Immature Granulocytes: 0.01 10*3/uL (ref 0.00–0.07)
Basophils Absolute: 0 10*3/uL (ref 0.0–0.1)
Basophils Relative: 0 %
Eosinophils Absolute: 0.2 10*3/uL (ref 0.0–0.5)
Eosinophils Relative: 3 %
HCT: 39.2 % (ref 36.0–46.0)
Hemoglobin: 13.3 g/dL (ref 12.0–15.0)
Immature Granulocytes: 0 %
Lymphocytes Relative: 61 %
Lymphs Abs: 4.1 10*3/uL — ABNORMAL HIGH (ref 0.7–4.0)
MCH: 30.9 pg (ref 26.0–34.0)
MCHC: 33.9 g/dL (ref 30.0–36.0)
MCV: 91 fL (ref 80.0–100.0)
Monocytes Absolute: 0.5 10*3/uL (ref 0.1–1.0)
Monocytes Relative: 8 %
Neutro Abs: 1.8 10*3/uL (ref 1.7–7.7)
Neutrophils Relative %: 28 %
Platelets: 345 10*3/uL (ref 150–400)
RBC: 4.31 MIL/uL (ref 3.87–5.11)
RDW: 11.6 % (ref 11.5–15.5)
WBC: 6.7 10*3/uL (ref 4.0–10.5)
nRBC: 0 % (ref 0.0–0.2)

## 2022-06-12 LAB — HCG, QUANTITATIVE, PREGNANCY: hCG, Beta Chain, Quant, S: 124474 m[IU]/mL — ABNORMAL HIGH (ref <5)

## 2022-06-12 LAB — POC URINE PREG, ED: Preg Test, Ur: POSITIVE — AB

## 2022-06-12 MED ORDER — METOCLOPRAMIDE HCL 5 MG/ML IJ SOLN
10.0000 mg | Freq: Once | INTRAMUSCULAR | Status: AC
  Administered 2022-06-13: 10 mg via INTRAVENOUS
  Filled 2022-06-12: qty 2

## 2022-06-12 MED ORDER — ONDANSETRON 4 MG PO TBDP
4.0000 mg | ORAL_TABLET | Freq: Once | ORAL | Status: AC
Start: 2022-06-12 — End: 2022-06-12
  Administered 2022-06-12: 4 mg via ORAL
  Filled 2022-06-12: qty 1

## 2022-06-12 MED ORDER — SODIUM CHLORIDE 0.9 % IV BOLUS (SEPSIS)
1000.0000 mL | Freq: Once | INTRAVENOUS | Status: AC
  Administered 2022-06-13: 1000 mL via INTRAVENOUS

## 2022-06-12 NOTE — ED Provider Notes (Signed)
[facility] Provider Note    Event Date/Time   First MD Initiated Contact with Patient 06/12/22 2355     (approximate)   History   Emesis During Pregnancy   HPI  Sara Stanley is a 32 y.o. female G2, P1 with previous C-section and umbilical hernia repair who presents to the emergency department with persistent nausea and vomiting.  Patient is approximately [redacted] weeks pregnant.  Has not yet seen her OB/GYN for this.  Has been taking over-the-counter B vitamins, Unisom, ginger root without relief.  No diarrhea.  No abdominal pain.  No vaginal bleeding or leaking fluid.  She also states that since being pregnant she is becoming increasingly constipated.   History provided by patient, husband.    History reviewed. No pertinent past medical history.  Past Surgical History:  Procedure Laterality Date   CESAREAN SECTION     UMBILICAL HERNIA REPAIR      MEDICATIONS:  Prior to Admission medications   Not on File    Physical Exam   Triage Vital Signs: ED Triage Vitals  Enc Vitals Group     BP 06/12/22 2226 113/85     Pulse Rate 06/12/22 2226 79     Resp 06/12/22 2226 18     Temp 06/12/22 2226 98.4 F (36.9 C)     Temp Source 06/12/22 2226 Oral     SpO2 06/12/22 2226 100 %     Weight 06/12/22 2225 210 lb (95.3 kg)     Height 06/12/22 2225 5\' 4"  (1.626 m)     Head Circumference --      Peak Flow --      Pain Score --      Pain Loc --      Pain Edu? --      Excl. in GC? --     Most recent vital signs: Vitals:   06/12/22 2226 06/13/22 0203  BP: 113/85 118/70  Pulse: 79 78  Resp: 18 18  Temp: 98.4 F (36.9 C) 98.3 F (36.8 C)  SpO2: 100% 98%    CONSTITUTIONAL: Alert, responds appropriately to questions. Well-appearing; well-nourished HEAD: Normocephalic, atraumatic EYES: Conjunctivae clear, pupils appear equal, sclera nonicteric ENT: normal nose; moist mucous membranes NECK: Supple, normal ROM CARD: RRR; S1 and S2 appreciated RESP:  Normal chest excursion without splinting or tachypnea; breath sounds clear and equal bilaterally; no wheezes, no rhonchi, no rales, no hypoxia or respiratory distress, speaking full sentences ABD/GI: Non-distended; soft, non-tender, no rebound, no guarding, no peritoneal signs BACK: The back appears normal EXT: Normal ROM in all joints; no deformity noted, no edema SKIN: Normal color for age and race; warm; no rash on exposed skin NEURO: Moves all extremities equally, normal speech PSYCH: The patient's mood and manner are appropriate.   ED Results / Procedures / Treatments   LABS: (all labs ordered are listed, but only abnormal results are displayed) Labs Reviewed  CBC WITH DIFFERENTIAL/PLATELET - Abnormal; Notable for the following components:      Result Value   Lymphs Abs 4.1 (*)    All other components within normal limits  COMPREHENSIVE METABOLIC PANEL - Abnormal; Notable for the following components:   Sodium 134 (*)    Glucose, Bld 102 (*)    ALT 61 (*)    All other components within normal limits  HCG, QUANTITATIVE, PREGNANCY - Abnormal; Notable for the following components:   hCG, Beta Chain, Quant, S [id number] (*)    All other components within  normal limits  URINALYSIS, ROUTINE W REFLEX MICROSCOPIC - Abnormal; Notable for the following components:   Color, Urine YELLOW (*)    APPearance HAZY (*)    Protein, ur >=300 (*)    Bacteria, UA RARE (*)    All other components within normal limits  POC URINE PREG, ED - Abnormal; Notable for the following components:   Preg Test, Ur POSITIVE (*)    All other components within normal limits     EKG:     RADIOLOGY: My personal review and interpretation of imaging:    I have personally reviewed all radiology reports.   No results found.   PROCEDURES:  Critical Care performed: No     Procedures    IMPRESSION / MDM / ASSESSMENT AND PLAN / ED COURSE  I reviewed the triage vital signs and the nursing  notes.    Patient here with vomiting in pregnancy.    DIFFERENTIAL DIAGNOSIS (includes but not limited to):   Vomiting in pregnancy, hyperemesis gravidarum, UTI, dehydration, constipation, doubt bowel obstruction   Patient's presentation is most consistent with acute complicated illness / injury requiring diagnostic workup.   PLAN: Labs obtained from triage show no leukocytosis, normal hemoglobin, normal electrolytes, LFTs.  hCG is 124,000.  Urine shows no sign of infection.  Will give IV fluids, Reglan and reassess.   MEDICATIONS GIVEN IN ED: Medications  ondansetron (ZOFRAN-ODT) disintegrating tablet 4 mg (4 mg Oral Given 06/12/22 2228)  sodium chloride 0.9 % bolus 1,000 mL (0 mLs Intravenous Stopped 06/13/22 0119)  metoCLOPramide (REGLAN) injection 10 mg (10 mg Intravenous Given 06/13/22 0025)     ED COURSE: She reports feeling much better.  Tolerating p.o.  Abdominal exam is benign.  I feel she is safe to be discharged.  Will discharge with Reglan.  She has OB/GYN follow-up scheduled.   At this time, I do not feel there is any life-threatening condition present. I reviewed all nursing notes, vitals, pertinent previous records.  All lab and urine results, EKGs, imaging ordered have been independently reviewed and interpreted by myself.  I reviewed all available radiology reports from any imaging ordered this visit.  Based on my assessment, I feel the patient is safe to be discharged home without further emergent workup and can continue workup as an outpatient as needed. Discussed all findings, treatment plan as well as usual and customary return precautions.  They verbalize understanding and are comfortable with this plan.  Outpatient follow-up has been provided as needed.  All questions have been answered.    CONSULTS:  none   OUTSIDE RECORDS REVIEWED: No previous records for review.       FINAL CLINICAL IMPRESSION(S) / ED DIAGNOSES   Final diagnoses:  Nausea and vomiting  in pregnancy  Constipation, unspecified constipation type     Rx / DC Orders   ED Discharge Orders          Ordered    metoCLOPramide (REGLAN) 10 MG tablet  3 times daily with meals        06/13/22 0114    docusate sodium (COLACE) 100 MG capsule  2 times daily        06/13/22 0114    polyethylene glycol (MIRALAX) 17 g packet  Daily        06/13/22 0114             Note:  This document was prepared using Dragon voice recognition software and may include unintentional dictation errors.   Latorsha Curling, Layla Maw,  DO 06/13/22 4098

## 2022-06-12 NOTE — ED Triage Notes (Signed)
Pt presents ambulatory to triage via POV with complaints of emesis with pregnancy. Pt is [redacted] weeks pregnant and has been vomiting for the last 5 days. A&Ox4 at this time. Denies vaginal bleeding, cramping, CP or SOB.

## 2022-06-13 MED ORDER — DOCUSATE SODIUM 100 MG PO CAPS: 100.0000 mg | ORAL_CAPSULE | Freq: Two times a day (BID) | ORAL | 2 refills | Status: DC

## 2022-06-13 MED ORDER — METOCLOPRAMIDE HCL 10 MG PO TABS: 10.0000 mg | ORAL_TABLET | Freq: Three times a day (TID) | ORAL | 1 refills | Status: DC

## 2022-06-13 MED ORDER — POLYETHYLENE GLYCOL 3350 17 G PO PACK: 17.0000 g | PACK | Freq: Every day | ORAL | 0 refills | Status: DC

## 2022-06-13 NOTE — Discharge Instructions (Addendum)
You may use over-the-counter Colace and MiraLAX as needed for constipation.  These medications are safe in pregnancy.  Please increase your water and fiber intake.

## 2022-06-24 DIAGNOSIS — Z3490 Encounter for supervision of normal pregnancy, unspecified, unspecified trimester: Secondary | ICD-10-CM | POA: Insufficient documentation

## 2022-10-10 ENCOUNTER — Observation Stay
Admission: EM | Admit: 2022-10-10 | Discharge: 2022-10-10 | Disposition: A | Discharge status: Home / Self Care | Payer: Medicaid Other | Attending: Obstetrics | Admitting: Obstetrics

## 2022-10-10 ENCOUNTER — Encounter: Payer: Self-pay | Admitting: Obstetrics and Gynecology

## 2022-10-10 ENCOUNTER — Other Ambulatory Visit: Payer: Self-pay

## 2022-10-10 DIAGNOSIS — Z3A26 26 weeks gestation of pregnancy: Secondary | ICD-10-CM | POA: Diagnosis not present

## 2022-10-10 DIAGNOSIS — O368120 Decreased fetal movements, second trimester, not applicable or unspecified: Secondary | ICD-10-CM | POA: Diagnosis present

## 2022-10-10 DIAGNOSIS — O368190 Decreased fetal movements, unspecified trimester, not applicable or unspecified: Principal | ICD-10-CM | POA: Diagnosis present
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
## 2022-10-10 NOTE — OB Triage Note (Signed)
Patient is a 32 yo, G2P1, at 26 weeks and 1 day. Patient presents with complaints of decreased fetal movement since 7am.  Patient denies any vaginal bleeding or LOF. Patient denies contractions. Monitors applied and assessing.  Initial fetal heart tone is 150. Katharina Caper CNM notified and aware of patients arrival to unit. Plan to do a NST and observe.

## 2022-10-10 NOTE — Progress Notes (Signed)
Discharge instructions provided to patient. Patient verbalized understanding. Pt educated on signs and symptoms of labor, vaginal bleeding, LOF, and fetal movement. Red flag signs reviewed by RN. Patient discharged home with significant other in stable condition.  

## 2022-12-01 ENCOUNTER — Encounter: Payer: Self-pay | Admitting: Obstetrics and Gynecology

## 2022-12-01 ENCOUNTER — Observation Stay
Admission: EM | Admit: 2022-12-01 | Discharge: 2022-12-02 | Disposition: A | Discharge status: Home / Self Care | Payer: Medicaid Other | Attending: Obstetrics and Gynecology | Admitting: Obstetrics and Gynecology

## 2022-12-01 ENCOUNTER — Other Ambulatory Visit: Payer: Self-pay

## 2022-12-01 DIAGNOSIS — O26893 Other specified pregnancy related conditions, third trimester: Secondary | ICD-10-CM | POA: Diagnosis present

## 2022-12-01 DIAGNOSIS — Z3A33 33 weeks gestation of pregnancy: Secondary | ICD-10-CM | POA: Insufficient documentation

## 2022-12-01 DIAGNOSIS — Z79899 Other long term (current) drug therapy: Secondary | ICD-10-CM | POA: Insufficient documentation

## 2022-12-01 DIAGNOSIS — Z98891 History of uterine scar from previous surgery: Secondary | ICD-10-CM | POA: Insufficient documentation

## 2022-12-01 DIAGNOSIS — O4703 False labor before 37 completed weeks of gestation, third trimester: Secondary | ICD-10-CM | POA: Diagnosis not present

## 2022-12-01 DIAGNOSIS — O4700 False labor before 37 completed weeks of gestation, unspecified trimester: Principal | ICD-10-CM | POA: Diagnosis present

## 2022-12-01 DIAGNOSIS — Z7982 Long term (current) use of aspirin: Secondary | ICD-10-CM | POA: Diagnosis not present

## 2022-12-01 MED ORDER — ACETAMINOPHEN 325 MG PO TABS: 650.0000 mg | ORAL_TABLET | ORAL | Status: DC | PRN

## 2022-12-01 MED ORDER — LACTATED RINGERS IV BOLUS
1000.0000 mL | Freq: Once | INTRAVENOUS | Status: AC
  Administered 2022-12-02: 1000 mL via INTRAVENOUS

## 2022-12-01 MED ORDER — CALCIUM CARBONATE ANTACID 500 MG PO CHEW: 2.0000 | CHEWABLE_TABLET | ORAL | Status: DC | PRN

## 2022-12-01 NOTE — OB Triage Note (Signed)
Patient is [redacted]w[redacted]d is a G2P1 seen at [facility] clinic. Patient is c/o contractions starting around 9pm tonight. Patient stated she did have intercourse around 3pm this afternoon. Has been drinking lots of water. Denies bleeding, urinary symptoms, and denies abnormal discharge or fluids. Patient did have protein in urine recently in office is on ASA. Monitors applied FHR 150 good variability. Provider to be notified.

## 2022-12-02 DIAGNOSIS — O4703 False labor before 37 completed weeks of gestation, third trimester: Secondary | ICD-10-CM | POA: Diagnosis not present
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
## 2022-12-12 ENCOUNTER — Encounter: Payer: Self-pay | Admitting: Obstetrics and Gynecology

## 2022-12-12 ENCOUNTER — Observation Stay
Admission: EM | Admit: 2022-12-12 | Discharge: 2022-12-12 | Disposition: A | Discharge status: Home / Self Care | Payer: Medicaid Other | Attending: Obstetrics and Gynecology | Admitting: Obstetrics and Gynecology

## 2022-12-12 DIAGNOSIS — Z3A35 35 weeks gestation of pregnancy: Secondary | ICD-10-CM | POA: Insufficient documentation

## 2022-12-12 DIAGNOSIS — O4703 False labor before 37 completed weeks of gestation, third trimester: Principal | ICD-10-CM | POA: Insufficient documentation

## 2022-12-12 DIAGNOSIS — Z98891 History of uterine scar from previous surgery: Secondary | ICD-10-CM | POA: Insufficient documentation

## 2022-12-12 DIAGNOSIS — O479 False labor, unspecified: Principal | ICD-10-CM | POA: Diagnosis present

## 2022-12-12 MED ORDER — TERBUTALINE SULFATE 1 MG/ML IJ SOLN
0.2500 mg | Freq: Once | INTRAMUSCULAR | Status: AC
  Administered 2022-12-12: 0.25 mg via SUBCUTANEOUS
  Filled 2022-12-12: qty 1

## 2022-12-12 MED ORDER — TERBUTALINE SULFATE 1 MG/ML IJ SOLN: 0.2500 mg | Freq: Once | INTRAMUSCULAR | Status: DC

## 2022-12-12 NOTE — OB Triage Note (Signed)
Pt denies any pain, positive fetal movement, no bleeding, leaking of fluid. Pt reports a resolve of contractions after terb dose. Discharge AVS given and precautions reviewed with pt. Pt verbalize understanding. Pt ambulated to exit with partner at 2128.

## 2022-12-12 NOTE — OB Triage Note (Signed)
32 y.o G2P1 presents to L&D triage at [redacted]w[redacted]d for ctx's that have been occurring since 1630 today. Pt denies lof, vaginal bleeding, and endorses good fetal movement. She mentions having no ctx's since her previous visit ~2 weeks ago. Monitors applied and assessing, [place] CNM aware of pt arrival.

## 2022-12-12 NOTE — Plan of Care (Signed)
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
## 2022-12-24 ENCOUNTER — Other Ambulatory Visit: Payer: Self-pay | Admitting: Obstetrics

## 2022-12-24 DIAGNOSIS — O99013 Anemia complicating pregnancy, third trimester: Secondary | ICD-10-CM
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
## 2022-12-26 ENCOUNTER — Ambulatory Visit
Admission: RE | Admit: 2022-12-26 | Discharge: 2022-12-26 | Disposition: A | Discharge status: Home / Self Care | Payer: Medicaid Other | Source: Ambulatory Visit | Attending: Obstetrics | Admitting: Obstetrics

## 2022-12-26 DIAGNOSIS — O99013 Anemia complicating pregnancy, third trimester: Secondary | ICD-10-CM | POA: Diagnosis present

## 2022-12-26 MED ORDER — SODIUM CHLORIDE 0.9 % IV SOLN
300.0000 mg | INTRAVENOUS | Status: DC
  Administered 2022-12-26: 300 mg via INTRAVENOUS
  Filled 2022-12-26: qty 300

## 2023-01-01 ENCOUNTER — Encounter
Admission: RE | Admit: 2023-01-01 | Discharge: 2023-01-01 | Disposition: A | Discharge status: Home / Self Care | Payer: Medicaid Other | Source: Ambulatory Visit | Attending: Obstetrics and Gynecology | Admitting: Obstetrics and Gynecology

## 2023-01-01 ENCOUNTER — Other Ambulatory Visit: Payer: Self-pay

## 2023-01-01 HISTORY — DX: Unspecified pre-eclampsia, unspecified trimester: O14.90

## 2023-01-01 HISTORY — DX: Iron deficiency anemia, unspecified: D50.9

## 2023-01-01 HISTORY — DX: Anemia complicating pregnancy, unspecified trimester: O99.019

## 2023-01-01 HISTORY — DX: Persistent proteinuria, unspecified: R80.1

## 2023-01-01 NOTE — Patient Instructions (Addendum)
Your procedure is scheduled on: Thursday, October 31  Arrival Time: Please call Labor and Delivery the day before your scheduled C-Section to find out your arrival time. [phone].  Arrival: If your arrival time is prior to 6:00 am, please enter through the Emergency Room Entrance and you will be directed to Labor and Delivery. If your arrival time is 6:00 am or later, please enter the Medical Mall and follow the greeter's instructions.  REMEMBER: Instructions that are not followed completely may result in serious medical risk, up to and including death; or upon the discretion of your surgeon and anesthesiologist your surgery may need to be rescheduled.  Do not eat food after midnight the night before surgery.  No gum chewing or hard candies.  You may however, drink CLEAR liquids up to 2 hours before you are scheduled to arrive for your surgery. Do not drink anything within 2 hours of your scheduled arrival time.  Clear liquids include: - water  - apple juice without pulp - gatorade (not RED colors) - black coffee or tea (Do NOT add milk or creamers to the coffee or tea) Do NOT drink anything that is not on this list.  One week prior to surgery: starting today, October 24 Stop Anti-inflammatories (NSAIDS) such as Advil, Aleve, Ibuprofen, Motrin, Naproxen, Naprosyn and Aspirin based products such as Excedrin, Goody's Powder, BC Powder. Stop ANY OVER THE COUNTER supplements until after surgery.  You may however, continue to take Tylenol if needed for pain up until the day of surgery.  Continue taking all of your other prescription medications up until the day of surgery.  DO NOT TAKE ANY MEDICATIONS ON THE DAY OF SURGERY  No Alcohol for 24 hours before or after surgery.  No Smoking including e-cigarettes for 24 hours prior to surgery.  No chewable tobacco products for at least 6 hours prior to surgery.  No nicotine patches on the day of surgery.  Do not use any "recreational"  drugs for at least a week prior to your surgery.  Please be advised that the combination of cocaine and anesthesia may have negative outcomes, up to and including death. If you test positive for cocaine, your surgery will be cancelled.  On the morning of surgery brush your teeth with toothpaste and water, you may rinse your mouth with mouthwash if you wish. Do not swallow any toothpaste or mouthwash.  Use CHG wipes as directed on instruction sheet.  Do not wear jewelry, make-up, hairpins, clips or nail polish.  For welded (permanent) jewelry: bracelets, anklets, waist bands, etc.  Please have this removed prior to surgery.  If it is not removed, there is a chance that hospital personnel will need to cut it off on the day of surgery.  Do not wear lotions, powders, or perfumes.   Do not shave body hair from the neck down 48 hours before surgery.  Contact lenses, hearing aids and dentures may not be worn into surgery.  Do not bring valuables to the hospital. [facility] is not responsible for any missing/lost belongings or valuables.   Notify your doctor if there is any change in your medical condition (cold, fever, infection).  Wear comfortable clothing (specific to your surgery type) to the hospital.  After surgery, you can help prevent lung complications by doing breathing exercises.  Take deep breaths and cough every 1-2 hours. Your doctor may order a device called an Incentive Spirometer to help you take deep breaths. When coughing or sneezing, hold a pillow  firmly against your incision with both hands. This is called "splinting." Doing this helps protect your incision. It also decreases belly discomfort.  Please call the Pre-admissions Testing Dept. at [phone] if you have any questions about these instructions.  Surgery Visitation Policy:  Visitor Passes   All visitors, including children, need an identification sticker when visiting. These stickers must be worn where  they can be seen.   Labor & Delivery  Laboring women may have one designated support person and two other visitors of any age visit. The support person must remain the same. The visitors may switch with other visitors. Visitation is permitted 24 hours per day. The designated support person or a visitor over the age of 16 may sleep overnight in the patient's room. A doula registered with [place] for labor and delivery support is not considered a visitor. Doulas not registered with  are considered visitors.  Mother Baby Unit, OB Specialty and Gynecological Care  A designated support person and three visitors of any age may visit. The three visitors may switch out. The designated support person or a visitor age 42 or older may stay overnight in the room. During the postpartum period (up to 6 weeks), if the mother is the patient, she can have her newborn stay with her if there is another support person present who can be responsible for the baby.     Preparing the Skin Before Surgery     To help prevent the risk of infection at your surgical site, we are now providing you with rinse-free Sage 2% Chlorhexidine Gluconate (CHG) disposable wipes.  Chlorhexidine Gluconate (CHG) Soap  o An antiseptic cleaner that kills germs and bonds with the skin to continue killing germs even after washing  o Used for showering the night before surgery and morning of surgery  The night before surgery: Shower or bathe with warm water. Do not apply perfume, lotions, powders. Wait one hour after shower. Skin should be dry and cool. Open Sage wipe package - use 6 disposable cloths. Wipe body using one cloth for the right arm, one cloth for the left arm, one cloth for the right leg, one cloth for the left leg, one cloth for the chest/abdomen area, and one cloth for the back. Do not use on open wounds or sores. Do not use on face or genitals (private parts). If you are breast feeding, do not  use on breasts. 5. Do not rinse, allow to dry. 6. Skin may feel "tacky" for several minutes. 7. Dress in clean clothes. 8. Place clean sheets on your bed and do not sleep with pets.  REPEAT ABOVE ON THE MORNING OF SURGERY BEFORE ARRIVING TO THE HOSPITAL.

## 2023-01-02 ENCOUNTER — Ambulatory Visit
Admission: RE | Admit: 2023-01-02 | Discharge: 2023-01-02 | Disposition: A | Discharge status: Home / Self Care | Payer: Medicaid Other | Source: Ambulatory Visit | Attending: Certified Nurse Midwife | Admitting: Certified Nurse Midwife

## 2023-01-02 DIAGNOSIS — O99013 Anemia complicating pregnancy, third trimester: Secondary | ICD-10-CM | POA: Diagnosis present

## 2023-01-02 DIAGNOSIS — Z3A00 Weeks of gestation of pregnancy not specified: Secondary | ICD-10-CM | POA: Diagnosis not present

## 2023-01-02 MED ORDER — IRON SUCROSE 300 MG IVPB - SIMPLE MED
300.0000 mg | Freq: Once | Status: AC
  Administered 2023-01-02: 300 mg via INTRAVENOUS
  Filled 2023-01-02: qty 300

## 2023-01-04 ENCOUNTER — Other Ambulatory Visit: Payer: Self-pay

## 2023-01-04 ENCOUNTER — Encounter: Payer: Self-pay | Admitting: Obstetrics and Gynecology

## 2023-01-04 ENCOUNTER — Observation Stay
Admission: EM | Admit: 2023-01-04 | Discharge: 2023-01-04 | Disposition: A | Discharge status: Home / Self Care | Payer: Medicaid Other | Attending: Obstetrics and Gynecology | Admitting: Obstetrics and Gynecology

## 2023-01-04 ENCOUNTER — Observation Stay: Payer: Medicaid Other

## 2023-01-04 DIAGNOSIS — O368130 Decreased fetal movements, third trimester, not applicable or unspecified: Principal | ICD-10-CM | POA: Insufficient documentation

## 2023-01-04 DIAGNOSIS — R519 Headache, unspecified: Secondary | ICD-10-CM | POA: Diagnosis present

## 2023-01-04 DIAGNOSIS — O99891 Other specified diseases and conditions complicating pregnancy: Secondary | ICD-10-CM | POA: Diagnosis not present

## 2023-01-04 DIAGNOSIS — Z3A38 38 weeks gestation of pregnancy: Secondary | ICD-10-CM | POA: Diagnosis not present

## 2023-01-04 DIAGNOSIS — O26893 Other specified pregnancy related conditions, third trimester: Principal | ICD-10-CM | POA: Diagnosis present

## 2023-01-04 DIAGNOSIS — Z7982 Long term (current) use of aspirin: Secondary | ICD-10-CM | POA: Diagnosis not present

## 2023-01-04 LAB — COMPREHENSIVE METABOLIC PANEL
ALT: 10 U/L (ref 0–44)
AST: 15 U/L (ref 15–41)
Albumin: 2.9 g/dL — ABNORMAL LOW (ref 3.5–5.0)
Alkaline Phosphatase: 107 U/L (ref 38–126)
Anion gap: 6 (ref 5–15)
BUN: 10 mg/dL (ref 6–20)
CO2: 21 mmol/L — ABNORMAL LOW (ref 22–32)
Calcium: 8.7 mg/dL — ABNORMAL LOW (ref 8.9–10.3)
Chloride: 111 mmol/L (ref 98–111)
Creatinine, Ser: 0.44 mg/dL (ref 0.44–1.00)
GFR, Estimated: >60 mL/min (ref >60)
Glucose, Bld: 92 mg/dL (ref 70–99)
Potassium: 3.7 mmol/L (ref 3.5–5.1)
Sodium: 138 mmol/L (ref 135–145)
Total Bilirubin: 0.5 mg/dL (ref 0.3–1.2)
Total Protein: 6.1 g/dL — ABNORMAL LOW (ref 6.5–8.1)

## 2023-01-04 LAB — PROTEIN / CREATININE RATIO, URINE
Creatinine, Urine: 224 mg/dL
Protein Creatinine Ratio: 0.58 mg/mg{creat} — ABNORMAL HIGH (ref 0.00–0.15)
Total Protein, Urine: 129 mg/dL

## 2023-01-04 LAB — CBC
HCT: 29.8 % — ABNORMAL LOW (ref 36.0–46.0)
Hemoglobin: 9.8 g/dL — ABNORMAL LOW (ref 12.0–15.0)
MCH: 28.2 pg (ref 26.0–34.0)
MCHC: 32.9 g/dL (ref 30.0–36.0)
MCV: 85.9 fL (ref 80.0–100.0)
Platelets: 279 10*3/uL (ref 150–400)
RBC: 3.47 MIL/uL — ABNORMAL LOW (ref 3.87–5.11)
RDW: 15.6 % — ABNORMAL HIGH (ref 11.5–15.5)
WBC: 6.6 10*3/uL (ref 4.0–10.5)
nRBC: 0.5 % — ABNORMAL HIGH (ref 0.0–0.2)

## 2023-01-04 MED ORDER — CALCIUM CARBONATE ANTACID 500 MG PO CHEW: 2.0000 | CHEWABLE_TABLET | ORAL | Status: DC | PRN

## 2023-01-04 MED ORDER — ACETAMINOPHEN 325 MG PO TABS
650.0000 mg | ORAL_TABLET | ORAL | Status: DC | PRN
  Administered 2023-01-04: 650 mg via ORAL
  Filled 2023-01-04: qty 2

## 2023-01-04 NOTE — OB Triage Note (Addendum)
Patient is a 32 yo, G2P1, at 38 weeks 3 days. Patient presents with complaints of decreased fetal movement.  Patient denies any vaginal bleeding or LOF. Monitors applied and assessing. VSS. Initial fetal heart tone 155. CNM notified of patients arrival to unit. NST in process.

## 2023-01-04 NOTE — Discharge Summary (Addendum)
Sara Stanley is a 32 y.o. female. She is at [redacted]w[redacted]d gestation. Patient's last menstrual period was 04/10/2022 (approximate). Estimated Date of Delivery: 01/15/23  Prenatal care site: [facility] [place]    Current pregnancy complicated by:  - previous cesarean section - obesity - history of pre-eclampsia with prior pregnancy - proteinuria throughout the entire pregnancy, seen by nephrology - Anemia  Chief complaint: headache, decreased fetal movement  She reports decreased fetal movement since last night where his movements were less than she is used to. This morning she did not feel movements at all but now is feeling some, just still less than she is used to feeling. She also reports a headache that started earlier. She has not taken any medication for the headache. She denies changes of vision or RUQ pain.  S: Resting comfortably. no CTX, no VB.no LOF,  Active fetal movement.  Denies: visual changes, SOB, or RUQ/epigastric pain  Maternal Medical History:   Past Medical History:  Diagnosis Date   Anemia in pregnancy    Iron deficiency anemia during pregnancy    Persistent proteinuria    Preeclampsia     Past Surgical History:  Procedure Laterality Date   CESAREAN SECTION  09/05/2012   UMBILICAL HERNIA REPAIR  1999    No Known Allergies  Prior to Admission medications   Medication Sig Start Date End Date Taking? Authorizing Provider  aspirin EC 81 MG tablet Take 81 mg by mouth daily. Swallow whole.   Yes [provider]  Prenatal Vit-Fe Fumarate-FA (PRENATAL MULTIVITAMIN) TABS tablet Take 1 tablet by mouth daily at 12 noon.    [provider]      Social History: She  reports that she has never smoked. She has never used smokeless tobacco. She reports that she does not currently use alcohol. She reports that she does not currently use drugs after having used the following drugs: Marijuana.  Family History: family history is not on file.  no  history of gyn cancers  Review of Systems: A full review of systems was performed and negative except as noted in the HPI.     O:  BP 120/72   Pulse 84   Temp 98.6 F (37 C) (Oral)   Resp 18   LMP 04/10/2022 (Approximate)  Vitals:   01/04/23 1340 01/04/23 1535 01/04/23 1550  BP: 130/86 128/75 120/72    Results for orders placed or performed during the hospital encounter of 01/04/23 (from the past 48 hour(s))  Comprehensive metabolic panel   Collection Time: 01/04/23  2:31 PM  Result Value Ref Range   Sodium 138 135 - 145 mmol/L   Potassium 3.7 3.5 - 5.1 mmol/L   Chloride 111 98 - 111 mmol/L   CO2 21 (L) 22 - 32 mmol/L   Glucose, Bld 92 70 - 99 mg/dL   BUN 10 6 - 20 mg/dL   Creatinine, Ser 8.65 0.44 - 1.00 mg/dL   Calcium 8.7 (L) 8.9 - 10.3 mg/dL   Total Protein 6.1 (L) 6.5 - 8.1 g/dL   Albumin 2.9 (L) 3.5 - 5.0 g/dL   AST 15 15 - 41 U/L   ALT 10 0 - 44 U/L   Alkaline Phosphatase 107 38 - 126 U/L   Total Bilirubin 0.5 0.3 - 1.2 mg/dL   GFR, Estimated >78 >46 mL/min   Anion gap 6 5 - 15  Protein / creatinine ratio, urine   Collection Time: 01/04/23  2:31 PM  Result Value Ref Range  Creatinine, Urine 224 mg/dL   Total Protein, Urine 129 mg/dL   Protein Creatinine Ratio 0.58 (H) 0.00 - 0.15 mg/mg[Cre]  CBC   Collection Time: 01/04/23  2:31 PM  Result Value Ref Range   WBC 6.6 4.0 - 10.5 K/uL   RBC 3.47 (L) 3.87 - 5.11 MIL/uL   Hemoglobin 9.8 (L) 12.0 - 15.0 g/dL   HCT 87.5 (L) 64.3 - 32.9 %   MCV 85.9 80.0 - 100.0 fL   MCH 28.2 26.0 - 34.0 pg   MCHC 32.9 30.0 - 36.0 g/dL   RDW 51.8 (H) 84.1 - 66.0 %   Platelets 279 150 - 400 K/uL   nRBC 0.5 (H) 0.0 - 0.2 %     Constitutional: NAD, AAOx3  HE/ENT: extraocular movements grossly intact, moist mucous membranes CV: RRR PULM: nl respiratory effort, CTABL     Abd: gravid, non-tender, non-distended, soft      Ext: Non-tender, Nonedematous   Psych: mood appropriate, speech normal Pelvic: deferred   Fetal   monitoring: Cat 1 Appropriate for GA Baseline: 140bpm Variability: moderate Accelerations: present x >2 Decelerations absent Time 2.5hrs  OB US: Cephalic position AFI 11.7cm  A/P: 32 y.o. [redacted]w[redacted]d here for antenatal surveillance for decreased fetal movement and headache.   Principle Diagnosis:  Decreased fetal movement, headache  Pre-eclampsia: not present. Headache improved with Tylenol. CBC and CMP are WNL. P/C ratio elevated but at her baseline for the entire pregnancy. Initial BP was 130/86 but came down to 120/72. Due to her elevated P/C ratio, discussed with Dr. Jean Rosenthal and stable for discharge with strict PIH precautions. She will call the office tomorrow to schedule a follow-up in the next 1-2 days prior to her scheduled c/s on 10/31. Decreased fetal movement: Reassuring fetal tracing for 2.5hrs, OB US shows AFI of 11.7. Modified BPP reassuring. Labor: not present.  Fetal Wellbeing: Reassuring Cat 1 tracing. Reactive NST  D/c home stable, precautions reviewed, follow-up as scheduled.    Janyce Llanos, CNM 01/04/2023 5:13 PM

## 2023-01-04 NOTE — Discharge Planning (Signed)
Discharge instructions provided to patient. Patient verbalized understanding. Pt educated on signs and symptoms of labor, vaginal bleeding, LOF, and fetal movement. Red flag signs reviewed by RN. Patient discharged home with significant other in stable condition.  

## 2023-01-06 ENCOUNTER — Encounter
Admission: RE | Admit: 2023-01-06 | Discharge: 2023-01-06 | Disposition: A | Discharge status: Home / Self Care | Payer: Medicaid Other | Source: Ambulatory Visit | Attending: Obstetrics and Gynecology | Admitting: Obstetrics and Gynecology

## 2023-01-06 DIAGNOSIS — Z01812 Encounter for preprocedural laboratory examination: Secondary | ICD-10-CM | POA: Insufficient documentation

## 2023-01-06 DIAGNOSIS — O26893 Other specified pregnancy related conditions, third trimester: Secondary | ICD-10-CM | POA: Diagnosis not present

## 2023-01-06 DIAGNOSIS — Z3403 Encounter for supervision of normal first pregnancy, third trimester: Secondary | ICD-10-CM

## 2023-01-06 DIAGNOSIS — Z3A39 39 weeks gestation of pregnancy: Secondary | ICD-10-CM | POA: Diagnosis not present

## 2023-01-06 LAB — CBC
HCT: 32.7 % — ABNORMAL LOW (ref 36.0–46.0)
Hemoglobin: 10.6 g/dL — ABNORMAL LOW (ref 12.0–15.0)
MCH: 28.9 pg (ref 26.0–34.0)
MCHC: 32.4 g/dL (ref 30.0–36.0)
MCV: 89.1 fL (ref 80.0–100.0)
Platelets: 293 10*3/uL (ref 150–400)
RBC: 3.67 MIL/uL — ABNORMAL LOW (ref 3.87–5.11)
RDW: 16.1 % — ABNORMAL HIGH (ref 11.5–15.5)
WBC: 5.6 10*3/uL (ref 4.0–10.5)
nRBC: 0 % (ref 0.0–0.2)

## 2023-01-06 LAB — TYPE AND SCREEN
ABO/RH(D): O POS
Antibody Screen: NEGATIVE
Extend sample reason: UNDETERMINED

## 2023-01-07 LAB — RPR: RPR Ser Ql: NONREACTIVE

## 2023-01-08 ENCOUNTER — Inpatient Hospital Stay
Admission: RE | Admit: 2023-01-08 | Discharge: 2023-01-10 | DRG: 787 | Disposition: A | Discharge status: Home / Self Care | Payer: Medicaid Other | Attending: Obstetrics and Gynecology | Admitting: Obstetrics and Gynecology

## 2023-01-08 ENCOUNTER — Inpatient Hospital Stay: Payer: Medicaid Other | Admitting: Anesthesiology

## 2023-01-08 ENCOUNTER — Inpatient Hospital Stay: Payer: Medicaid Other | Admitting: Urgent Care

## 2023-01-08 ENCOUNTER — Other Ambulatory Visit: Payer: Self-pay

## 2023-01-08 ENCOUNTER — Encounter: Payer: Self-pay | Admitting: Obstetrics and Gynecology

## 2023-01-08 ENCOUNTER — Encounter
Admission: RE | Disposition: A | Discharge status: Home / Self Care | Payer: Self-pay | Source: Home / Self Care | Attending: Obstetrics and Gynecology

## 2023-01-08 DIAGNOSIS — Z7982 Long term (current) use of aspirin: Secondary | ICD-10-CM | POA: Diagnosis not present

## 2023-01-08 DIAGNOSIS — O9081 Anemia of the puerperium: Secondary | ICD-10-CM | POA: Diagnosis not present

## 2023-01-08 DIAGNOSIS — Z3A39 39 weeks gestation of pregnancy: Secondary | ICD-10-CM

## 2023-01-08 DIAGNOSIS — O34211 Maternal care for low transverse scar from previous cesarean delivery: Secondary | ICD-10-CM | POA: Diagnosis present

## 2023-01-08 DIAGNOSIS — O99214 Obesity complicating childbirth: Secondary | ICD-10-CM | POA: Diagnosis present

## 2023-01-08 DIAGNOSIS — Z3403 Encounter for supervision of normal first pregnancy, third trimester: Principal | ICD-10-CM

## 2023-01-08 DIAGNOSIS — O0990 Supervision of high risk pregnancy, unspecified, unspecified trimester: Secondary | ICD-10-CM

## 2023-01-08 DIAGNOSIS — D62 Acute posthemorrhagic anemia: Secondary | ICD-10-CM | POA: Diagnosis not present

## 2023-01-08 DIAGNOSIS — O26893 Other specified pregnancy related conditions, third trimester: Secondary | ICD-10-CM | POA: Diagnosis present

## 2023-01-08 DIAGNOSIS — O0993 Supervision of high risk pregnancy, unspecified, third trimester: Secondary | ICD-10-CM

## 2023-01-08 LAB — ABO/RH: ABO/RH(D): O POS

## 2023-01-08 SURGERY — Surgical Case
Anesthesia: Spinal

## 2023-01-08 MED ORDER — GLYCOPYRROLATE 0.2 MG/ML IJ SOLN
INTRAMUSCULAR | Status: DC | PRN
  Administered 2023-01-08: .2 mg via INTRAVENOUS

## 2023-01-08 MED ORDER — FENTANYL CITRATE (PF) 100 MCG/2ML IJ SOLN
INTRAMUSCULAR | Status: DC | PRN
  Administered 2023-01-08: 15 ug via INTRAVENOUS

## 2023-01-08 MED ORDER — SOD CITRATE-CITRIC ACID 500-334 MG/5ML PO SOLN
ORAL | Status: AC
  Administered 2023-01-08: 30 mL via ORAL
  Filled 2023-01-08: qty 15

## 2023-01-08 MED ORDER — OXYCODONE HCL 5 MG PO TABS
5.0000 mg | ORAL_TABLET | Freq: Four times a day (QID) | ORAL | Status: DC | PRN
  Administered 2023-01-09 – 2023-01-10 (×3): 5 mg via ORAL
  Filled 2023-01-08 (×2): qty 1

## 2023-01-08 MED ORDER — MORPHINE SULFATE (PF) 0.5 MG/ML IJ SOLN
INTRAMUSCULAR | Status: AC
  Filled 2023-01-08: qty 10

## 2023-01-08 MED ORDER — FENTANYL CITRATE (PF) 100 MCG/2ML IJ SOLN
INTRAMUSCULAR | Status: AC
  Filled 2023-01-08: qty 2

## 2023-01-08 MED ORDER — KETOROLAC TROMETHAMINE 30 MG/ML IJ SOLN
INTRAMUSCULAR | Status: DC | PRN
  Administered 2023-01-08: 30 mg via INTRAVENOUS

## 2023-01-08 MED ORDER — ONDANSETRON HCL 4 MG/2ML IJ SOLN
INTRAMUSCULAR | Status: DC | PRN
  Administered 2023-01-08: 4 mg via INTRAVENOUS

## 2023-01-08 MED ORDER — DIPHENHYDRAMINE HCL 50 MG/ML IJ SOLN
INTRAMUSCULAR | Status: DC | PRN
  Administered 2023-01-08: 12.5 mg via INTRAVENOUS

## 2023-01-08 MED ORDER — SODIUM CHLORIDE 0.9% FLUSH: 10.0000 mL | INTRAVENOUS | Status: DC | PRN

## 2023-01-08 MED ORDER — SODIUM CHLORIDE 0.9% FLUSH
INTRAVENOUS | Status: DC | PRN
  Administered 2023-01-08: 30 mL via INTRAVENOUS

## 2023-01-08 MED ORDER — DIPHENHYDRAMINE HCL 50 MG/ML IJ SOLN: 12.5000 mg | INTRAMUSCULAR | Status: DC | PRN

## 2023-01-08 MED ORDER — OXYTOCIN-SODIUM CHLORIDE 30-0.9 UT/500ML-% IV SOLN
2.5000 [IU]/h | INTRAVENOUS | Status: AC
  Administered 2023-01-08: 2.5 [IU]/h via INTRAVENOUS

## 2023-01-08 MED ORDER — GLYCOPYRROLATE 0.2 MG/ML IJ SOLN
INTRAMUSCULAR | Status: AC
  Filled 2023-01-08: qty 1

## 2023-01-08 MED ORDER — PHENYLEPHRINE HCL-NACL 20-0.9 MG/250ML-% IV SOLN
INTRAVENOUS | Status: AC
  Filled 2023-01-08: qty 250

## 2023-01-08 MED ORDER — CHLORHEXIDINE GLUCONATE 0.12 % MT SOLN
15.0000 mL | Freq: Once | OROMUCOSAL | Status: AC
Start: 2023-01-08 — End: 2023-01-08
  Administered 2023-01-08: 15 mL via OROMUCOSAL
  Filled 2023-01-08: qty 15

## 2023-01-08 MED ORDER — BUPIVACAINE IN DEXTROSE 0.75-8.25 % IT SOLN
INTRATHECAL | Status: DC | PRN
  Administered 2023-01-08: 1.5 mL via INTRATHECAL

## 2023-01-08 MED ORDER — 0.9 % SODIUM CHLORIDE (POUR BTL) OPTIME
TOPICAL | Status: DC | PRN
  Administered 2023-01-08: 1000 mL

## 2023-01-08 MED ORDER — POVIDONE-IODINE 10 % EX SWAB
2.0000 | Freq: Once | CUTANEOUS | Status: AC
  Administered 2023-01-08: 2 via TOPICAL

## 2023-01-08 MED ORDER — SCOPOLAMINE 1 MG/3DAYS TD PT72
1.0000 | MEDICATED_PATCH | Freq: Once | TRANSDERMAL | Status: DC
  Administered 2023-01-08: 1.5 mg via TRANSDERMAL
  Filled 2023-01-08: qty 1

## 2023-01-08 MED ORDER — ORAL CARE MOUTH RINSE: 15.0000 mL | Freq: Once | OROMUCOSAL | Status: AC

## 2023-01-08 MED ORDER — MORPHINE SULFATE (PF) 0.5 MG/ML IJ SOLN
INTRAMUSCULAR | Status: DC | PRN
  Administered 2023-01-08: .1 mg via EPIDURAL

## 2023-01-08 MED ORDER — DIPHENHYDRAMINE HCL 50 MG/ML IJ SOLN
INTRAMUSCULAR | Status: AC
  Filled 2023-01-08: qty 1

## 2023-01-08 MED ORDER — ONDANSETRON HCL 4 MG/2ML IJ SOLN: 4.0000 mg | Freq: Three times a day (TID) | INTRAMUSCULAR | Status: DC | PRN

## 2023-01-08 MED ORDER — NALOXONE HCL 4 MG/10ML IJ SOLN: 1.0000 ug/kg/h | INTRAVENOUS | Status: DC | PRN

## 2023-01-08 MED ORDER — PHENYLEPHRINE HCL-NACL 20-0.9 MG/250ML-% IV SOLN
INTRAVENOUS | Status: DC | PRN
  Administered 2023-01-08: 50 ug/min via INTRAVENOUS

## 2023-01-08 MED ORDER — KETOROLAC TROMETHAMINE 30 MG/ML IJ SOLN
INTRAMUSCULAR | Status: AC
  Filled 2023-01-08: qty 1

## 2023-01-08 MED ORDER — CEFAZOLIN SODIUM-DEXTROSE 2-4 GM/100ML-% IV SOLN
2.0000 g | INTRAVENOUS | Status: AC
  Administered 2023-01-08: 2 g via INTRAVENOUS
  Filled 2023-01-08: qty 100

## 2023-01-08 MED ORDER — OXYTOCIN-SODIUM CHLORIDE 30-0.9 UT/500ML-% IV SOLN
INTRAVENOUS | Status: DC | PRN
  Administered 2023-01-08: 500 mL via INTRAVENOUS

## 2023-01-08 MED ORDER — NALOXONE HCL 0.4 MG/ML IJ SOLN: 0.4000 mg | INTRAMUSCULAR | Status: DC | PRN

## 2023-01-08 MED ORDER — ONDANSETRON HCL 4 MG/2ML IJ SOLN
INTRAMUSCULAR | Status: AC
  Filled 2023-01-08: qty 2

## 2023-01-08 MED ORDER — ACETAMINOPHEN 500 MG PO TABS: 1000.0000 mg | ORAL_TABLET | Freq: Four times a day (QID) | ORAL | Status: DC

## 2023-01-08 MED ORDER — LACTATED RINGERS IV SOLN: INTRAVENOUS | Status: DC

## 2023-01-08 MED ORDER — SODIUM CHLORIDE 0.9% FLUSH: 3.0000 mL | INTRAVENOUS | Status: DC | PRN

## 2023-01-08 MED ORDER — BUPIVACAINE-EPINEPHRINE (PF) 0.5% -1:200000 IJ SOLN
INTRAMUSCULAR | Status: DC | PRN
  Administered 2023-01-08: 30 mL

## 2023-01-08 MED ORDER — BUPIVACAINE HCL (PF) 0.25 % IJ SOLN
INTRAMUSCULAR | Status: AC
  Filled 2023-01-08: qty 60

## 2023-01-08 MED ORDER — KETOROLAC TROMETHAMINE 30 MG/ML IJ SOLN: 30.0000 mg | Freq: Four times a day (QID) | INTRAMUSCULAR | Status: AC | PRN

## 2023-01-08 MED ORDER — LACTATED RINGERS IV SOLN
Freq: Once | INTRAVENOUS | Status: AC
Start: 2023-01-08 — End: 2023-01-08

## 2023-01-08 MED ORDER — SOD CITRATE-CITRIC ACID 500-334 MG/5ML PO SOLN: 30.0000 mL | ORAL | Status: AC

## 2023-01-08 MED ORDER — PHENYLEPHRINE 80 MCG/ML (10ML) SYRINGE FOR IV PUSH (FOR BLOOD PRESSURE SUPPORT)
PREFILLED_SYRINGE | INTRAVENOUS | Status: DC | PRN
  Administered 2023-01-08: 160 ug via INTRAVENOUS

## 2023-01-08 MED ORDER — ACETAMINOPHEN 500 MG PO TABS
1000.0000 mg | ORAL_TABLET | Freq: Four times a day (QID) | ORAL | Status: DC
  Administered 2023-01-08 – 2023-01-10 (×7): 1000 mg via ORAL
  Filled 2023-01-08 (×8): qty 2

## 2023-01-08 MED ORDER — OXYTOCIN-SODIUM CHLORIDE 30-0.9 UT/500ML-% IV SOLN
INTRAVENOUS | Status: AC
  Filled 2023-01-08: qty 1000

## 2023-01-08 MED ORDER — BUPIVACAINE HCL (PF) 0.5 % IJ SOLN
INTRAMUSCULAR | Status: AC
  Filled 2023-01-08: qty 30

## 2023-01-08 MED ORDER — KETOROLAC TROMETHAMINE 30 MG/ML IJ SOLN
30.0000 mg | Freq: Four times a day (QID) | INTRAMUSCULAR | Status: AC | PRN
  Administered 2023-01-09 (×2): 30 mg via INTRAVENOUS
  Filled 2023-01-08 (×2): qty 1

## 2023-01-08 MED ORDER — DIPHENHYDRAMINE HCL 25 MG PO CAPS: 25.0000 mg | ORAL_CAPSULE | ORAL | Status: DC | PRN

## 2023-01-08 SURGICAL SUPPLY — 37 items
APL PRP STRL LF DISP 70% ISPRP (MISCELLANEOUS) ×1
BARRIER ADHS 3X4 INTERCEED (GAUZE/BANDAGES/DRESSINGS) IMPLANT
BNDG TENSOPLAST 6X5 (GAUZE/BANDAGES/DRESSINGS) IMPLANT
BRR ADH 4X3 ABS CNTRL BYND (GAUZE/BANDAGES/DRESSINGS)
CHLORAPREP W/TINT 26 (MISCELLANEOUS) ×1 IMPLANT
DRSG CURAFIL 4X4 STRL (GAUZE/BANDAGES/DRESSINGS) IMPLANT
DRSG TELFA 3X8 NADH (GAUZE/BANDAGES/DRESSINGS) ×1 IMPLANT
DRSG TELFA 3X8 NADH STRL (GAUZE/BANDAGES/DRESSINGS) ×1 IMPLANT
ELECT REM PT RETURN 9FT ADLT (ELECTROSURGICAL) ×1
ELECTRODE REM PT RTRN 9FT ADLT (ELECTROSURGICAL) ×1 IMPLANT
GAUZE CURAFIL 4X4 (GAUZE/BANDAGES/DRESSINGS) IMPLANT
GAUZE SPONGE 4X4 12PLY STRL (GAUZE/BANDAGES/DRESSINGS) ×1 IMPLANT
GOWN STRL REUS W/ TWL LRG LVL3 (GOWN DISPOSABLE) ×3 IMPLANT
GOWN STRL REUS W/TWL LRG LVL3 (GOWN DISPOSABLE) ×3
MANIFOLD NEPTUNE II (INSTRUMENTS) ×1 IMPLANT
MAT PREVALON FULL STRYKER (MISCELLANEOUS) ×1 IMPLANT
NDL HYPO 25GX1X1/2 BEV (NEEDLE) ×1 IMPLANT
NEEDLE HYPO 25GX1X1/2 BEV (NEEDLE) ×1 IMPLANT
NS IRRIG 1000ML POUR BTL (IV SOLUTION) ×1 IMPLANT
PACK C SECTION AR (MISCELLANEOUS) ×1 IMPLANT
PAD DRESSING TELFA 3X8 NADH (GAUZE/BANDAGES/DRESSINGS) IMPLANT
PAD OB MATERNITY 4.3X12.25 (PERSONAL CARE ITEMS) ×1 IMPLANT
PAD PREP OB/GYN DISP 24X41 (PERSONAL CARE ITEMS) ×1 IMPLANT
SCRUB CHG 4% DYNA-HEX 4OZ (MISCELLANEOUS) ×1 IMPLANT
STAPLER INSORB 30 2030 C-SECTI (MISCELLANEOUS) IMPLANT
SUT MNCRL 4-0 (SUTURE) ×1
SUT MNCRL 4-0 27XMFL (SUTURE) ×1
SUT VIC AB 0 CT1 36 (SUTURE) ×2 IMPLANT
SUT VIC AB 0 CTX 36 (SUTURE) ×2
SUT VIC AB 0 CTX36XBRD ANBCTRL (SUTURE) ×2 IMPLANT
SUT VIC AB 2-0 SH 27 (SUTURE) ×2
SUT VIC AB 2-0 SH 27XBRD (SUTURE) ×2 IMPLANT
SUTURE MNCRL 4-0 27XMF (SUTURE) ×1 IMPLANT
SYR 30ML LL (SYRINGE) ×2 IMPLANT
TAPE PAPER 3X10 WHT MICROPORE (GAUZE/BANDAGES/DRESSINGS) IMPLANT
TRAP FLUID SMOKE EVACUATOR (MISCELLANEOUS) ×1 IMPLANT
WATER STERILE IRR 500ML POUR (IV SOLUTION) ×1 IMPLANT

## 2023-01-08 NOTE — Anesthesia Postprocedure Evaluation (Signed)
Anesthesia Post Note  Patient: Sara Stanley  Procedure(s) Performed: REPEAT CESAREAN SECTION  Patient location during evaluation: PACU Anesthesia Type: Spinal Level of consciousness: oriented and awake and alert Pain management: pain level controlled Vital Signs Assessment: post-procedure vital signs reviewed and stable Respiratory status: spontaneous breathing, respiratory function stable and patient connected to nasal cannula oxygen Cardiovascular status: blood pressure returned to baseline and stable Postop Assessment: no headache, no backache and no apparent nausea or vomiting Anesthetic complications: no   No notable events documented.   Last Vitals:  Vitals:   01/08/23 1730 01/08/23 1753  BP: (!) 114/97   Pulse: (!) 56 61  Resp:    Temp:    SpO2: 90% 94%    Last Pain:  Vitals:   01/08/23 1730  TempSrc:   PainSc: 0-No pain                 Dawson Albers C Blake Vetrano

## 2023-01-08 NOTE — Op Note (Signed)
  Cesarean Section Procedure Note  Date of procedure: 01/08/2023   Pre-operative Diagnosis: Intrauterine pregnancy at [redacted]w[redacted]d;  - repeat cesarean  Post-operative Diagnosis: same, delivered.  Procedure: Repeat Low Transverse Cesarean Section through Pfannenstiel incision  Surgeon: Christeen Douglas, MD  Assistant(s):  Donato Schultz, CNM   An experienced assistant was required given the standard of surgical care given the complexity of the case.  This assistant was needed for exposure, dissection, suctioning, retraction, instrument exchange,  CNM assisting with delivery with administration of fundal pressure, and for overall help during the procedure.   Anesthesia: Spinal anesthesia  Anesthesiologist: Marisue Humble, MD Anesthesiologist: Marisue Humble, MD CRNA: Nelle Don, CRNA; Omer Jack, CRNA  Estimated Blood Loss:  200 mL         Drains: Foley         Total IV Fluids:  Urine Output: 40ml         Specimens: cord blood         Complications:  None; patient tolerated the procedure well.         Disposition: PACU - hemodynamically stable.         Condition: stable  Findings:  A female infant "Dre'on" in cephalic presentation. Amniotic fluid - Clear  Birth weight 3110 g.   APGAR (1 MIN): 8  APGAR (5 MINS): 9  APGAR (10 MINS):     Intact placenta with a three-vessel cord.  Grossly normal uterus, tubes and ovaries bilaterally. Moderate intraabdominal adhesions were noted.  Indications:  previous LTCS  Procedure Details  The patient was taken to Operating Room, identified as the correct patient and the procedure verified as C-Section Delivery. A formal Time Out was held with all team members present and in agreement.  After induction of anesthesia, the patient was draped and prepped in the usual sterile manner. A Pfannenstiel skin incision was made and carried down through the subcutaneous tissue to the fascia. Fascial incision was made and  extended transversely with the Mayo scissors. The fascia was separated from the underlying rectus tissue superiorly and inferiorly. The peritoneum was identified and entered bluntly. Peritoneal incision was extended longitudinally. The utero-vesical peritoneal reflection was incised transversely and a bladder flap was created digitally.   A low transverse hysterotomy was made. The fetus was delivered atraumatically. The umbilical cord was clamped x2 and cut and the infant was handed to the awaiting pediatricians. The placenta was removed intact and appeared normal, intact, and with a 3-vessel cord.   The uterus was exteriorized and cleared of all clot and debris. The hysterotomy was closed with running sutures of 0-Vicryl. Excellent hemostasis was observed. The peritoneal cavity was cleared of all clots and debris. The uterus was returned to the abdomen.   Gutters and pelvis were evaluated and excellent hemostasis was noted. The fascia was then reapproximated with running sutures of 0 Maxon.  The skin was reapproximated with Ensorb absorbable staples. 30 of 0.5% bupivicaine was placed in the fascial and skin lines.  Instrument, sponge, and needle counts were correct prior to the abdominal closure and at the conclusion of the case.   The patient tolerated the procedure well and was transferred to the recovery room in stable condition.   Christeen Douglas, MD 01/08/2023

## 2023-01-08 NOTE — Transfer of Care (Signed)
Immediate Anesthesia Transfer of Care Note  Patient: Sara Stanley  Procedure(s) Performed: REPEAT CESAREAN SECTION  Patient Location: Mother/Baby  Anesthesia Type:Spinal  Level of Consciousness: awake, alert , and oriented  Airway & Oxygen Therapy: Patient Spontanous Breathing  Post-op Assessment: Report given to RN and Post -op Vital signs reviewed and stable  Post vital signs: Reviewed and stable  Last Vitals:  Vitals Value Taken Time  BP 106/63   Temp    Pulse 64   Resp 15   SpO2 93     Last Pain:  Vitals:   01/08/23 1502  TempSrc: Oral  PainSc: 0-No pain         Complications: No notable events documented.

## 2023-01-08 NOTE — Anesthesia Postprocedure Evaluation (Signed)
Anesthesia Post Note  Patient: Sara Stanley  Procedure(s) Performed: REPEAT CESAREAN SECTION  Anesthesia Type: Spinal Anesthetic complications: no   No notable events documented.   Last Vitals:  Vitals:   01/08/23 1730 01/08/23 1753  BP: (!) 114/97   Pulse: (!) 56 61  Resp:    Temp:    SpO2: 90% 94%    Last Pain:  Vitals:   01/08/23 1730  TempSrc:   PainSc: 0-No pain                 Glanda Spanbauer C Rita Vialpando

## 2023-01-08 NOTE — Anesthesia Preprocedure Evaluation (Addendum)
Anesthesia Evaluation  Patient identified by MRN, date of birth, ID band Patient awake    Reviewed: Allergy & Precautions, H&P , NPO status , Patient's Chart, lab work & pertinent test results  Airway Mallampati: II  TM Distance: >3 FB Neck ROM: full    Dental no notable dental hx.    Pulmonary neg pulmonary ROS   Pulmonary exam normal        Cardiovascular Exercise Tolerance: Good Normal cardiovascular exam     Neuro/Psych    GI/Hepatic negative GI ROS,,,  Endo/Other    Renal/GU   negative genitourinary   Musculoskeletal   Abdominal  (+) + obese  Peds  Hematology  (+) Blood dyscrasia, anemia   Anesthesia Other Findings Previous C/S x_1_ a.C/s done for: Failed induction after 3 days  Anemia in Pregnancy a.9.6 at 26 wk appt b.Iron infusions ordered 12/24/22 FLD c.Consider referral to hematology postpartum for profound anemia both pregnancies, son with sickle-cell trait  Now 10.6  H/o Preeclampsia in g1  Hx of Renal Disease in Pregnancy (G1) a.         Nephrology consult - auto immune work up negative, unsure preexisting renal disease vs pregnancy induced renal disease    Past Medical History: No date: Anemia in pregnancy No date: Iron deficiency anemia during pregnancy No date: Persistent proteinuria No date: Preeclampsia  Past Surgical History: 09/05/2012: CESAREAN SECTION 1999: UMBILICAL HERNIA REPAIR  BMI    Body Mass Index: 39.68 kg/m      Reproductive/Obstetrics (+) Pregnancy                             Anesthesia Physical Anesthesia Plan  ASA: 2  Anesthesia Plan: Spinal   Post-op Pain Management:    Induction:   PONV Risk Score and Plan: Ondansetron  Airway Management Planned: Natural Airway  Additional Equipment:   Intra-op Plan:   Post-operative Plan:   Informed Consent: I have reviewed the patients History and Physical, chart, labs and  discussed the procedure including the risks, benefits and alternatives for the proposed anesthesia with the patient or authorized representative who has indicated his/her understanding and acceptance.     Dental Advisory Given  Plan Discussed with: Anesthesiologist and CRNA  Anesthesia Plan Comments:         Anesthesia Quick Evaluation

## 2023-01-08 NOTE — Interval H&P Note (Signed)
History and Physical Interval Note:  01/08/2023 10:47 AM  Sara Stanley  has presented today for surgery, with the diagnosis of Repeat cesarean.  The various methods of treatment have been discussed with the patient and family. After consideration of risks, benefits and other options for treatment, the patient has consented to  Procedure(s): REPEAT CESAREAN SECTION (N/A) as a surgical intervention.  The patient's history has been reviewed, patient examined, no change in status, stable for surgery.  I have reviewed the patient's chart and labs.  Questions were answered to the patient's satisfaction.     Christeen Douglas

## 2023-01-09 ENCOUNTER — Encounter: Payer: Self-pay | Admitting: Obstetrics and Gynecology

## 2023-01-09 DIAGNOSIS — O0990 Supervision of high risk pregnancy, unspecified, unspecified trimester: Secondary | ICD-10-CM

## 2023-01-09 LAB — CBC
HCT: 28.5 % — ABNORMAL LOW (ref 36.0–46.0)
Hemoglobin: 9.2 g/dL — ABNORMAL LOW (ref 12.0–15.0)
MCH: 29.1 pg (ref 26.0–34.0)
MCHC: 32.3 g/dL (ref 30.0–36.0)
MCV: 90.2 fL (ref 80.0–100.0)
Platelets: 258 10*3/uL (ref 150–400)
RBC: 3.16 MIL/uL — ABNORMAL LOW (ref 3.87–5.11)
RDW: 16.7 % — ABNORMAL HIGH (ref 11.5–15.5)
WBC: 7.9 10*3/uL (ref 4.0–10.5)
nRBC: 0 % (ref 0.0–0.2)

## 2023-01-09 MED ORDER — MEASLES, MUMPS & RUBELLA VAC IJ SOLR
0.5000 mL | Freq: Once | INTRAMUSCULAR | Status: DC
  Filled 2023-01-09: qty 0.5

## 2023-01-09 MED ORDER — BISACODYL 10 MG RE SUPP: 10.0000 mg | Freq: Every day | RECTAL | Status: DC | PRN

## 2023-01-09 MED ORDER — SIMETHICONE 80 MG PO CHEW
80.0000 mg | CHEWABLE_TABLET | Freq: Three times a day (TID) | ORAL | Status: DC
  Administered 2023-01-09 – 2023-01-10 (×4): 80 mg via ORAL
  Filled 2023-01-09 (×4): qty 1

## 2023-01-09 MED ORDER — DIBUCAINE (PERIANAL) 1 % EX OINT: 1.0000 | TOPICAL_OINTMENT | CUTANEOUS | Status: DC | PRN

## 2023-01-09 MED ORDER — WITCH HAZEL-GLYCERIN EX PADS: 1.0000 | MEDICATED_PAD | CUTANEOUS | Status: DC | PRN

## 2023-01-09 MED ORDER — SODIUM CHLORIDE 0.9% FLUSH: 10.0000 mL | INTRAVENOUS | Status: DC | PRN

## 2023-01-09 MED ORDER — TETANUS-DIPHTH-ACELL PERTUSSIS 5-2.5-18.5 LF-MCG/0.5 IM SUSY: 0.5000 mL | PREFILLED_SYRINGE | Freq: Once | INTRAMUSCULAR | Status: DC

## 2023-01-09 MED ORDER — MEPERIDINE HCL 25 MG/ML IJ SOLN: 6.2500 mg | INTRAMUSCULAR | Status: DC | PRN

## 2023-01-09 MED ORDER — MENTHOL 3 MG MT LOZG: 1.0000 | LOZENGE | OROMUCOSAL | Status: DC | PRN

## 2023-01-09 MED ORDER — COCONUT OIL OIL
1.0000 | TOPICAL_OIL | Status: DC | PRN
  Administered 2023-01-10: 1 via TOPICAL
  Filled 2023-01-09: qty 7.5

## 2023-01-09 MED ORDER — PRENATAL MULTIVITAMIN CH
1.0000 | ORAL_TABLET | Freq: Every day | ORAL | Status: DC
  Administered 2023-01-09 – 2023-01-10 (×2): 1 via ORAL
  Filled 2023-01-09 (×2): qty 1

## 2023-01-09 MED ORDER — OXYCODONE HCL 5 MG PO TABS
5.0000 mg | ORAL_TABLET | ORAL | Status: DC | PRN
  Administered 2023-01-09: 5 mg via ORAL
  Filled 2023-01-09 (×2): qty 1

## 2023-01-09 MED ORDER — KETOROLAC TROMETHAMINE 30 MG/ML IJ SOLN
30.0000 mg | Freq: Four times a day (QID) | INTRAMUSCULAR | Status: AC
  Administered 2023-01-09 – 2023-01-10 (×3): 30 mg via INTRAVENOUS
  Filled 2023-01-09 (×3): qty 1

## 2023-01-09 MED ORDER — DIPHENHYDRAMINE HCL 25 MG PO CAPS: 25.0000 mg | ORAL_CAPSULE | Freq: Four times a day (QID) | ORAL | Status: DC | PRN

## 2023-01-09 MED ORDER — IBUPROFEN 600 MG PO TABS
600.0000 mg | ORAL_TABLET | Freq: Four times a day (QID) | ORAL | Status: DC
  Administered 2023-01-10: 600 mg via ORAL
  Filled 2023-01-09: qty 1

## 2023-01-09 MED ORDER — GABAPENTIN 300 MG PO CAPS
300.0000 mg | ORAL_CAPSULE | Freq: Every day | ORAL | Status: DC
  Administered 2023-01-09: 300 mg via ORAL
  Filled 2023-01-09: qty 1

## 2023-01-09 MED ORDER — FLEET ENEMA RE ENEM: 1.0000 | ENEMA | Freq: Every day | RECTAL | Status: DC | PRN

## 2023-01-09 MED ORDER — SENNOSIDES-DOCUSATE SODIUM 8.6-50 MG PO TABS
2.0000 | ORAL_TABLET | ORAL | Status: DC
  Administered 2023-01-09 – 2023-01-10 (×2): 2 via ORAL
  Filled 2023-01-09 (×2): qty 2

## 2023-01-09 MED ORDER — FERROUS SULFATE 325 (65 FE) MG PO TABS
325.0000 mg | ORAL_TABLET | Freq: Two times a day (BID) | ORAL | Status: DC
  Administered 2023-01-09 – 2023-01-10 (×3): 325 mg via ORAL
  Filled 2023-01-09 (×3): qty 1

## 2023-01-09 MED ORDER — SIMETHICONE 80 MG PO CHEW: 80.0000 mg | CHEWABLE_TABLET | ORAL | Status: DC | PRN

## 2023-01-09 NOTE — Lactation Note (Signed)
This note was copied from a baby's chart. Lactation Consultation Note  Patient Name: Boy Sheilla Maris WGNFA'O Date: 01/09/2023 Age:30 hours Reason for consult: Initial assessment;Term   Maternal Data Does the patient have breastfeeding experience prior to this delivery?: Yes How long did the patient breastfeed?: 4 months  Lactation to room for an initial assessment w/ a P2 patient and a 25hr old baby boy.  This was a c-section delivery.  Feeding goal is breastfeeding.  Patient stated that she did not have a breastpump at home.  Per patient she nursed her first child for 4 months.   Feeding Mother's Current Feeding Choice: Breast Milk  No feeding observed.   Interventions Interventions: Breast feeding basics reviewed;DEBP;Education;CDC Guidelines for Breast Pump Cleaning  LC provided education on the following;  milk production expectations, hunger cues, day 1/2 wet/dirty diapers, hand expression, cluster feeding, benefits of STS and arousing infant for a feeding.  Lactation informed patient of feeding infant at least 8 or more times w/in a 24hr period but not exceeding 3hrs. Patient verbalized understanding. Breastmilk storage guidelines were also provided and reviewed.   Patient had a DEBP kit in room prior to entry.  LC pointed out how to use the Medela Manual pump in the kit.   LC provided patient with information on how to call [facility] for a breastpump.      Discharge Pump: DEBP;Advised to call insurance company [facility] Program: Yes  Consult Status Consult Status: Follow-up Follow-up type: In-patient    Yvette Rack Free 01/09/2023, 5:50 PM

## 2023-01-09 NOTE — Progress Notes (Signed)
Postop Day  1  Subjective: 32 y.o. [id number] postpartum day #1 status post repeat cesarean section. She is ambulating, is tolerating po, is voiding spontaneously.  Her pain is well controlled on PO pain medications. Her lochia is less than menses.  Objective: BP 123/74 (BP Location: Left Arm)   Pulse 80   Temp 98.2 F (36.8 C) (Oral)   Resp 20   Ht 5\' 3"  (1.6 m)   Wt 101.6 kg   LMP 04/10/2022 (Approximate)   SpO2 93%   Breastfeeding Unknown   BMI 39.68 kg/m    Physical Exam:  General: alert, cooperative, appears stated age, and no distress Breasts: soft/nontender Pulm: nl effort Abdomen: soft, non-tender, active bowel sounds Uterine Fundus: firm Incision: Pressure dressing intact, clean, and dry. No bleeding or drainage noted to pressure dressing.  Perineum:  No edema, intact Lochia: appropriate DVT Evaluation: No evidence of DVT seen on physical exam.  Recent Labs    01/09/23 0728  HGB 9.2*  HCT 28.5*  WBC 7.9  PLT 258    Assessment/Plan: 32 y.o. [id number] postpartum day # 1  1. Continue routine postpartum care  2. Infant feeding status: breast feeding -Lactation consult PRN for breastfeeding   3. Contraception plan: condoms  4. Acute blood loss anemia - clinically significant.  -Hemodynamically stable and asymptomatic -Intervention: start on oral supplementation with ferrous sulfate 325 mg   5. Immunization status:   all immunizations up to date  Disposition: continue inpatient postpartum care    LOS: 1 day   Raechell Singleton, CNM 01/09/2023, 1:06 PM   ----- Roney Jaffe Certified Nurse Midwife [facility]

## 2023-01-10 MED ORDER — IBUPROFEN 600 MG PO TABS: 600.0000 mg | ORAL_TABLET | Freq: Four times a day (QID) | ORAL | 1 refills | Status: AC | PRN

## 2023-01-10 MED ORDER — OXYCODONE HCL 5 MG PO TABS: 5.0000 mg | ORAL_TABLET | ORAL | 0 refills | Status: AC | PRN

## 2023-01-10 MED ORDER — OXYCODONE HCL 5 MG PO TABS: 5.0000 mg | ORAL_TABLET | ORAL | 0 refills | Status: DC | PRN

## 2023-01-10 MED ORDER — IBUPROFEN 600 MG PO TABS: 600.0000 mg | ORAL_TABLET | Freq: Four times a day (QID) | ORAL | 1 refills | Status: DC | PRN

## 2023-01-10 NOTE — Discharge Summary (Signed)
Postpartum Discharge Summary  Patient Name: Sara Stanley DOB: April 21, 1990 MRN: [id number]  Date of admission: 01/08/2023 Delivery date:01/08/2023 Delivering provider: Christeen Douglas Date of discharge: 01/10/2023  Primary OB: [facility] [id number] last menstrual period was 04/10/2022 (approximate). EDC Estimated Date of Delivery: 01/15/23 Gestational Age at Delivery: [redacted]w[redacted]d   Admitting diagnosis: Supervision of high risk pregnancy in third trimester [O09.93] Pregnancy, supervision, high-risk [O09.90] Intrauterine pregnancy: [redacted]w[redacted]d     Secondary diagnosis:   Principal Problem:   Supervision of high risk pregnancy in third trimester Active Problems:   Pregnancy, supervision, high-risk   Discharge Diagnosis: Term Pregnancy Delivered      Hospital course: Sceduled C/S   32 y.o. yo [id number] at [redacted]w[redacted]d was admitted to the hospital 01/08/2023 for scheduled cesarean section with the following indication:Elective Repeat.Delivery details are as follows:  Membrane Rupture Time/Date: 4:08 PM,01/08/2023  Delivery Method:C-Section, Low Transverse Operative Delivery:N/A Details of operation can be found in separate operative note.  Patient had a postpartum course complicated by none.  She is ambulating, tolerating a regular diet, passing flatus, and urinating well. Patient is discharged home in stable condition on  01/10/23        Newborn Data: Birth date:01/08/2023 Birth time:4:09 PM Gender:Female Living status:Living Apgars:8 ,9  Weight:3110 g                                              Post partum procedures: N/A Augmentation:: N/A Complications: None Delivery Type: repeat cesarean section, low transverse incision Anesthesia: spinal anesthesia Placenta: manual removal To Pathology: No   Prenatal Labs:  ABO, Rh: --/--/O POS Performed at [facility], [address] [place], [place] [address]  [phone]) Antibody: NEG (10/29 0836) Rubella:   RPR: NON  REACTIVE (10/29 0836)  HBsAg:    HIV:    GBS: neg 1 hr Glucola  105 Genetic screening normal Anatomy US normal  Magnesium Sulfate received: No BMZ received: No Rhophylac:was not indicated MMR: was not indicated Varivax vaccine given: was not indicated T-DaP:Given prenatally Flu: Given prenatally  Transfusion:No  Physical exam  Vitals:   01/09/23 1527 01/09/23 2025 01/09/23 2256 01/10/23 0738  BP: (!) 113/57 (!) 125/55 (!) 112/57 121/69  Pulse: 74 76 76 71  Resp: 16 20 20 18   Temp: 98.3 F (36.8 C) 98.5 F (36.9 C) 98.7 F (37.1 C) 98.5 F (36.9 C)  TempSrc: Oral Oral Oral Oral  SpO2: 98% 97% 95% 97%  Weight:      Height:       General: alert, cooperative, and no distress Lochia: appropriate Uterine Fundus: firm Perineum: minimal edema/intact Incision: Healing well with no significant drainage, No significant erythema, Dressing is clean, dry, and intact, covered with occlusive OP site dressing  DVT Evaluation: No evidence of DVT seen on physical exam.  Labs: Lab Results  Component Value Date   WBC 7.9 01/09/2023   HGB 9.2 (L) 01/09/2023   HCT 28.5 (L) 01/09/2023   MCV 90.2 01/09/2023   PLT 258 01/09/2023      Latest Ref Rng & Units 01/04/2023    2:31 PM  CMP  Glucose 70 - 99 mg/dL 92   BUN 6 - 20 mg/dL 10   Creatinine 5.64 - 1.00 mg/dL 3.32   Sodium 951 - 884 mmol/L 138   Potassium 3.5 - 5.1 mmol/L 3.7   Chloride 98 -  111 mmol/L 111   CO2 22 - 32 mmol/L 21   Calcium 8.9 - 10.3 mg/dL 8.7   Total Protein 6.5 - 8.1 g/dL 6.1   Total Bilirubin 0.3 - 1.2 mg/dL 0.5   Alkaline Phos 38 - 126 U/L 107   AST 15 - 41 U/L 15   ALT 0 - 44 U/L 10    Edinburgh Score:    01/09/2023    7:30 PM  Edinburgh Postnatal Depression Scale Screening Tool  I have been able to laugh and see the funny side of things. --    Risk assessment for postpartum VTE and prophylactic treatment: Very high risk factors: None High risk factors: None Moderate risk factors: Cesarean  delivery  and BMI 30-40 kg/m2  Postpartum VTE prophylaxis with LMWH not indicated  After visit meds:  Allergies as of 01/10/2023   No Known Allergies      Medication List     STOP taking these medications    aspirin EC 81 MG tablet       TAKE these medications    ibuprofen 600 MG tablet Commonly known as: ADVIL Take 1 tablet (600 mg total) by mouth every 6 (six) hours as needed for mild pain (pain score 1-3), moderate pain (pain score 4-6) or cramping.   oxyCODONE 5 MG immediate release tablet Commonly known as: Oxy IR/ROXICODONE Take 1 tablet (5 mg total) by mouth every 4 (four) hours as needed for up to 7 days for severe pain (pain score 7-10).   prenatal multivitamin Tabs tablet Take 1 tablet by mouth daily at 12 noon.       Discharge home in stable condition Infant Feeding: Breast Infant Disposition:home with mother Discharge instruction: per After Visit Summary and Postpartum booklet. Activity: Advance as tolerated. Pelvic rest for 6 weeks.  Diet: routine diet Anticipated Birth Control: Unsure Postpartum Appointment:6 weeks Additional Postpartum F/U: Incision check 2 weeks  Future Appointments:No future appointments. Follow up Visit:  Follow-up Information     Christeen Douglas, MD Follow up in 2 week(s).   Specialty: Obstetrics and Gynecology Why: For postop check Contact information: [address] [place] [address] [phone]                 Plan:  Sara Stanley was discharged to home in good condition. Follow-up appointment as directed.    Signed:  Margaretmary Eddy, CNM Certified Nurse Midwife [facility]

## 2023-01-10 NOTE — Progress Notes (Signed)
Discharge instructions reviewed with patient.  Printed copies given to patient for reference after discharge home.  Questions answered and follow up care reviewed.

## 2023-01-10 NOTE — Lactation Note (Signed)
This note was copied from a baby's chart. Lactation Consultation Note  Patient Name: Sara Stanley [id number]'A Date: 01/10/2023 Age:30 hours Reason for consult: Follow-up assessment;Term;Maternal discharge   Maternal Data Has patient been taught Hand Expression?: Yes Does the patient have breastfeeding experience prior to this delivery?: Yes How long did the patient breastfeed?: 4 mths  Feeding Mother's Current Feeding Choice: Breast Milk  LATCH Score Latch: Grasps breast easily, tongue down, lips flanged, rhythmical sucking.  Audible Swallowing: Spontaneous and intermittent  Type of Nipple: Everted at rest and after stimulation  Comfort (Breast/Nipple): Filling, red/small blisters or bruises, mild/mod discomfort (breasts filling)  Hold (Positioning): Assistance needed to correctly position infant at breast and maintain latch.  LATCH Score: 8   Lactation Tools Discussed/Used    Interventions Interventions: Breast feeding basics reviewed;Assisted with latch;Hand express;Adjust position;Support pillows;Education  Discharge Discharge Education: Engorgement and breast care;Warning signs for feeding baby Pump: Manual WIC Program: Yes  Consult Status Consult Status: PRN Date: 01/10/23 Follow-up type: In-patient    Dyann Kief 01/10/2023, 3:32 PM

## 2023-01-10 NOTE — Discharge Instructions (Addendum)
Cesarean Delivery, Care After   Call the office to get appointment for 2 weeks for postop visit & Schedule a 6 weeks postpartum visit. Refer to this sheet in the next few weeks. These instructions provide you with information on caring for yourself after your procedure. Your health care provider may also give   you specific instructions. Your treatment has been planned according to current medical practices, but problems sometimes occur. Call your health care provider if you have any problems or questions after you go home. HOME CARE INSTRUCTIONS  Please leave honey comb dressing (OP Site) on for 7 days.  You may shower during this period but turn your back to the water so that the dressing does not get directly saturated by the water.   You may take the dressing off on day 7.  The easiest way to do it is in the shower.  Allow the water to run over the dressing and it usually comes off easier.   Only take over-the-counter or prescription medications as directed by your health care provider. Do not drink alcohol, especially if you are breastfeeding or taking medication to relieve pain. Do not  smoke tobacco. Continue to use good perineal care. Good perineal care includes: Wiping your perineum from front to back. Keeping your perineum clean. Check your surgical cut (incision) daily for increased redness, drainage, swelling, or separation of skin. Shower and clean your incision gently with soap and water every day, by letting warm and soapy water run over the incision, and then pat it dry. If your health care provider says it is okay, leave the incision uncovered. Use a bandage (dressing) if the incision is draining fluid or appears irritated. If the adhesive strips across the incision do not fall off within 7 days, carefully peel them off, after a shower. Hug a pillow when coughing or sneezing until your incision is healed. This helps to relieve pain. Do not use tampons, douches or have sexual  intercourse, until your health care provider says it is okay. Wear a well-fitting bra that provides breast support. Limit wearing support panties or control-top hose. Drink enough fluids to keep your urine clear or pale yellow. Eat high-fiber foods such as whole grain cereals and breads, brown rice, beans, and fresh fruits and vegetables every day. These foods may help prevent or relieve constipation. Resume activities such as climbing stairs, driving, lifting, exercising, or traveling as directed by your health care provider. Try to have someone help you with your household activities and your newborn for at least a few days after you leave the hospital. Rest as much as possible. Try to rest or take a nap when your newborn is sleeping. Increase your activities gradually. Do not lift more than 15lbs until directed by a provider. Keep all of your scheduled postpartum appointments. It is very important to keep your scheduled follow-up appointments. At these appointments, your health care provider will be checking to make sure that you are healing physically and emotionally. SEEK MEDICAL CARE IF:  You are passing large clots from your vagina. Save any clots to show your health care provider. You have a foul smelling discharge from your vagina. You have trouble urinating. You are urinating frequently. You have pain when you urinate. You have a change in your bowel movements. You have increasing redness, pain, or swelling near your incision. You have pus draining from your incision. Your incision is separating. You have painful, hard, or reddened breasts. You have a severe headache. You  have blurred vision or see spots. You feel sad or depressed. You have thoughts of hurting yourself or your newborn. You have questions about your care, the care of your newborn, or medications. You are dizzy or light-headed. You have a rash. You have pain, redness, or swelling at the site of the removed  intravenous access (IV) tube. You have nausea or vomiting. You stopped breastfeeding and have not had a menstrual period within 12 weeks of stopping. You are not breastfeeding and have not had a menstrual period within 12 weeks of delivery. You have a fever. SEEK IMMEDIATE MEDICAL CARE IF: You have persistent pain. You have chest pain. You have shortness of breath. You faint. You have leg pain. You have stomach pain. Your vaginal bleeding saturates 2 or more sanitary pads in 1 hour. MAKE SURE YOU:  Understand these instructions. Will watch your condition. Will get help right away if you are not doing well or get worse. Document Released: 11/16/2001 Document Revised: 07/11/2013 Document Reviewed: 10/22/2011 [facility] Patient Information 2015 [place], [place]. This information is not intended to replace advice given to you by your health care provider. Make sure you discuss any questions you have with your health care provider.

## 2023-11-24 ENCOUNTER — Inpatient Hospital Stay
Admit: 2023-11-24 | Discharge: 2023-11-24 | Disposition: A | Discharge status: Home / Self Care | Payer: Medicaid (Managed Care) | Arrived: WI

## 2023-11-24 DIAGNOSIS — N3001 Acute cystitis with hematuria: Secondary | ICD-10-CM

## 2023-11-24 DIAGNOSIS — Z5329 Procedure and treatment not carried out because of patient's decision for other reasons: Principal | ICD-10-CM

## 2023-11-24 DIAGNOSIS — Z3201 Encounter for pregnancy test, result positive: Principal | ICD-10-CM

## 2023-11-24 LAB — URINALYSIS
Bilirubin, Urine: NEGATIVE
Glucose, Ur: NEGATIVE mg/dL
Ketones, Urine: 40 mg/dL — AB
Leukocyte Esterase, Urine: NEGATIVE
Nitrite, Urine: NEGATIVE
Protein, UA: >=300 mg/dL — AB
Specific Gravity, UA: >1.03 — ABNORMAL HIGH (ref 1.005–1.030)
Urobilinogen, Urine: 2 EU/dL — ABNORMAL HIGH (ref 0.0–1.0)
pH, Urine: 6.5 (ref 5.0–8.0)

## 2023-11-24 LAB — MICROSCOPIC URINALYSIS
RBC, UA: 3 /HPF — AB
WBC, UA: 0 /HPF

## 2023-11-24 LAB — PREGNANCY, URINE: Pregnancy, Urine: POSITIVE — AB

## 2023-11-24 MED ORDER — CEPHALEXIN 500 MG PO CAPS
500 | ORAL_CAPSULE | Freq: Three times a day (TID) | ORAL | 0 refills | 7.00000 days | Status: AC
Start: 2023-11-24 — End: 2023-11-29

## 2023-11-24 NOTE — ED Provider Notes (Signed)
 [facility]     NAME: Judith Bryant  DOB:  1990/10/16  MRN:  [id number]  Date of evaluation: 11/24/2023  Provider: Oneil DELENA Fend, PA-C  PCP: No primary care provider on file.  Note Started : 3:52 PM EDT 11/24/23    Chief Complaint: Pregnancy Test (Pt requesting a pregnancy test. Stated she took a at home test 2 weeks ago and stated it was positive, went to OB and US  confirmed she was preg. Then today went for a follow up and they stated she was not pregnant. )      This is a 33 year old female that presents to UC stating that several weeks ago she took a home pregnancy test which was positive. She then went to her OBGYN whom she states confirmed her pregnancy. She states today she went to the GYN office and she states she was told that she was not pregnant. Patient states she was not seen for an appointment. She has not taken any further pregnancy tests recently. Currently, she denies any abdominal pain, NVD or urinary symptoms. No vaginal bleeding or discharge. No chest pain or shortness of breath. She states she is currently breast feeding her child who was born in October, 2024. On first contact with patient, the patient is in no acute distress. She states first day of her last menstrual period was around 09/26/2023.          Review of Systems  Pertinent positives and negatives are stated within HPI, all other systems reviewed and are negative.     Allergies: Patient has no known allergies.     --------------------------------------------- PAST HISTORY ---------------------------------------------  Past Medical History:  has no past medical history on file.    Past Surgical History:  has a past surgical history that includes Cesarean section and Cesarean section.    Social History:  reports that she quit smoking about 6 years ago. She has never used smokeless tobacco. She reports current alcohol use. She reports that she does not use drugs.    Family History: family history is not on file.      The patient's home medications have been reviewed.    The nursing notes within the ED encounter have been reviewed.     ------------------------------------------------SCREENINGS----------------------------------------------                        CIWA Assessment  BP: 126/79  Pulse: 84           ---------------------------------------------PHYSICAL EXAM --------------------------------------------    Vitals:    11/24/23 1543 11/24/23 1544   BP:  126/79   Pulse:  84   Resp:  20   Temp:  97 F (36.1 C)   SpO2:  100%   Weight: 102.1 kg (225 lb)      Oxygen Saturation Interpretation: Normal     Physical Exam  Vitals and nursing note reviewed.   Constitutional:       General: She is not in acute distress.     Appearance: Normal appearance. She is well-developed.   HENT:      Head: Normocephalic and atraumatic.      Jaw: There is normal jaw occlusion.      Right Ear: Hearing and external ear normal.      Left Ear: Hearing and external ear normal.      Nose: Nose normal.      Mouth/Throat:      Mouth: Mucous membranes are moist.  Pharynx: Oropharynx is clear. Uvula midline.      Comments: Oropharynx is patent.     Eyes:      Pupils: Pupils are equal, round, and reactive to light.   Cardiovascular:      Rate and Rhythm: Normal rate and regular rhythm.      Heart sounds: Normal heart sounds.   Pulmonary:      Effort: Pulmonary effort is normal.      Breath sounds: Normal breath sounds.   Abdominal:      Palpations: Abdomen is soft.      Tenderness: There is no abdominal tenderness. There is no right CVA tenderness or left CVA tenderness.   Musculoskeletal:      Cervical back: Full passive range of motion without pain, normal range of motion and neck supple.   Lymphadenopathy:      Cervical: No cervical adenopathy.   Skin:     General: Skin is warm and dry.      Findings: No rash.   Neurological:      General: No focal deficit present.      Mental Status: She is alert and oriented to person, place, and time.          -------------------------------------------------- RESULTS -------------------------------------------------  Results for orders placed or performed during the hospital encounter of 11/24/23   Urinalysis   Result Value Ref Range    Color, UA Yellow Yellow    Turbidity UA Cloudy (A) Clear    Glucose, Ur NEGATIVE NEGATIVE mg/dL    Bilirubin, Urine NEGATIVE NEGATIVE    Ketones, Urine 40 (A) NEGATIVE mg/dL    Specific Gravity, UA >1.030 (H) 1.005 - 1.030    Urine Hgb TRACE (A) NEGATIVE    pH, Urine 6.5 5.0 - 8.0    Protein, UA >=300 (A) NEGATIVE mg/dL    Urobilinogen, Urine 2.0 (H) 0.0 - 1.0 EU/dL    Nitrite, Urine NEGATIVE NEGATIVE    Leukocyte Esterase, Urine NEGATIVE NEGATIVE   Pregnancy, Urine   Result Value Ref Range    Pregnancy, Urine POSITIVE (A) NEGATIVE   Microscopic Urinalysis   Result Value Ref Range    WBC, UA 0 TO 5 0 TO 5 /HPF    RBC, UA 3 to 5 (A) 0 TO 2 /HPF    Bacteria, UA 1+ (A) None    Mucus, UA PRESENT      No orders to display       Labs Reviewed   URINALYSIS - Abnormal; Notable for the following components:       Result Value    Turbidity UA Cloudy (*)     Ketones, Urine 40 (*)     Specific Gravity, UA >1.030 (*)     Urine Hgb TRACE (*)     Protein, UA >=300 (*)     Urobilinogen, Urine 2.0 (*)     All other components within normal limits   PREGNANCY, URINE - Abnormal; Notable for the following components:    Pregnancy, Urine POSITIVE (*)     All other components within normal limits   MICROSCOPIC URINALYSIS - Abnormal; Notable for the following components:    RBC, UA 3 to 5 (*)     Bacteria, UA 1+ (*)     All other components within normal limits   CULTURE, URINE       When ordered only abnormal lab results are displayed. All other labs were within normal range or not returned as of this dictation.  Interpretation per the Radiologist below, if available at the time of this note:    No orders to display     No results found.    No results found.      -------------------------------------------------PROCEDURES--------------------------------------------  Unless otherwise noted below, none      Procedures      ---EMERGENCY DEPARTMENT COURSE and DIFFERENTIAL DIAGNOSIS/MDM---  (CC/HPI Summary, DDx, ED Course, and Reassessment:) (Disposition Considerations (include 1 Tests not done, Admit vs D/C, Shared Decision Making, Pt Expectation of Test or Tx., Consults, Social Determinants, Chronic Conditiions, Records reviewed)    MDM  Number of Diagnoses or Management Options  Acute cystitis with hematuria  Positive pregnancy test  Diagnosis management comments: This is a 33 year old female that presents to UC stating that several weeks ago she took a home pregnancy test which was positive. She then went to her OBGYN whom she states confirmed her pregnancy. She states today she went to the GYN office and she states she was told that she was not pregnant. Patient states she was not seen for an appointment. She has not taken any further pregnancy tests recently. Currently, she denies any abdominal pain, NVD or urinary symptoms. No vaginal bleeding or discharge. No chest pain or shortness of breath. She states she is currently breast feeding her child who was born in October, 2024. Urine pregnancy test is positive. Urine is cloudy, +1 bacteria. Obtain urine culture.  Rx keflex . Recommend follow up with her GYN or GYN on call. If symptoms worsen go to the emergency department.          DISCHARGE MEDICATIONS:  New Prescriptions    CEPHALEXIN  (KEFLEX ) 500 MG CAPSULE    Take 1 capsule by mouth 3 times daily for 5 days       DISCONTINUED MEDICATIONS:  Discontinued Medications    No medications on file       PATIENT REFERRED TO:  Wilder Hasten, MD  [address]  [facility]  [facility]  [place] [address]  [phone]      Loras SHIPPER on call    Albina Zona MATSU, MD  [address] [place] [address]  [phone]            ---------------------------------  ADDITIONAL PROVIDER NOTES ---------------------------------  I have spoken with the patient and discussed today's results, in addition to providing specific details for the plan of care and counseling regarding the diagnosis and prognosis.  Their questions are answered at this time and they are agreeable with the plan.   This patient is stable for discharge.  I have shared the specific conditions for return, as well as the importance of follow-up.      * NOTE: (Please note that portions of this note were completed with a voice recognition program.  Efforts were made to edit the dictations but occasionally words are mis-transcribed.)    --------------------------------- IMPRESSION AND DISPOSITION ---------------------------------    IMPRESSION  1. Positive pregnancy test    2. Acute cystitis with hematuria        Disposition: Discharge to home  Patient condition is good    I am the Primary Clinician of Record.     Oneil DELENA Fend, PA-C (electronically signed) on 11/24/2023 at 4:29 PM         Fend Oneil DELENA, PA-C  11/24/23 1629

## 2023-11-24 NOTE — Discharge Instructions (Addendum)
 Follow up with OBGYN  Keep up with fluid intake  No ibuprofen during pregnancy  Tylenol  for pain/fever as directed  If symptoms worsen go to the emergency department.

## 2023-11-26 LAB — CULTURE, URINE: Culture: NO GROWTH

## 2024-01-20 ENCOUNTER — Inpatient Hospital Stay
Admit: 2024-01-20 | Discharge: 2024-01-21 | Disposition: A | Discharge status: Home / Self Care | Payer: Medicaid (Managed Care) | Arrived: VH

## 2024-01-20 ENCOUNTER — Emergency Department: Admit: 2024-01-20 | Payer: Medicaid (Managed Care)

## 2024-01-20 DIAGNOSIS — O23592 Infection of other part of genital tract in pregnancy, second trimester: Principal | ICD-10-CM

## 2024-01-20 LAB — COMPREHENSIVE METABOLIC PANEL W/ REFLEX TO MG FOR LOW K
ALT: 9 U/L (ref 0–35)
AST: 10 U/L (ref 0–35)
Albumin: 3.9 g/dL (ref 3.5–5.2)
Alkaline Phosphatase: 91 U/L (ref 35–104)
Anion Gap: 12 mmol/L (ref 7–16)
BUN: 7 mg/dL (ref 6–20)
CO2: 24 mmol/L (ref 22–29)
Calcium: 9.7 mg/dL (ref 8.6–10.0)
Chloride: 102 mmol/L (ref 98–107)
Creatinine: 0.5 mg/dL (ref 0.5–1.0)
Est, Glom Filt Rate: >90 mL/min/1.73m2 (ref >60)
Glucose: 99 mg/dL (ref 74–99)
Potassium: 3.4 mmol/L — ABNORMAL LOW (ref 3.5–5.1)
Sodium: 138 mmol/L (ref 136–145)
Total Bilirubin: 0.4 mg/dL (ref 0.0–1.2)
Total Protein: 7.6 g/dL (ref 6.4–8.3)

## 2024-01-20 LAB — CBC WITH AUTO DIFFERENTIAL
Basophils %: 0 % (ref 0.0–2.0)
Basophils Absolute: 0.02 k/uL (ref 0.00–0.20)
Eosinophils %: 3 % (ref 0–6)
Eosinophils Absolute: 0.21 k/uL (ref 0.05–0.50)
Hematocrit: 37.1 % (ref 34.0–48.0)
Hemoglobin: 12.5 g/dL (ref 11.5–15.5)
Immature Granulocytes %: 0 % (ref 0.0–5.0)
Immature Granulocytes Absolute: <0.03 k/uL (ref 0.00–0.58)
Lymphocytes %: 18 % — ABNORMAL LOW (ref 20.0–42.0)
Lymphocytes Absolute: 1.21 k/uL — ABNORMAL LOW (ref 1.50–4.00)
MCH: 29.3 pg (ref 26.0–35.0)
MCHC: 33.7 g/dL (ref 32.0–34.5)
MCV: 86.9 fL (ref 80.0–99.9)
MPV: 9.1 fL (ref 7.0–12.0)
Monocytes %: 7 % (ref 2.0–12.0)
Monocytes Absolute: 0.45 k/uL (ref 0.10–0.95)
Neutrophils %: 71 % (ref 43.0–80.0)
Neutrophils Absolute: 4.65 k/uL (ref 1.80–7.30)
Platelets: 384 k/uL (ref 130–450)
RBC: 4.27 m/uL (ref 3.50–5.50)
RDW: 12.4 % (ref 11.5–15.0)
WBC: 6.6 k/uL (ref 4.5–11.5)

## 2024-01-20 LAB — POC PREGNANCY UR-QUAL
HCG, Urine, POC: POSITIVE
Lot Number: 955242

## 2024-01-20 LAB — LACTIC ACID: Lactic Acid: 0.8 mmol/L (ref 0.5–2.2)

## 2024-01-20 LAB — URINALYSIS WITH MICROSCOPIC
Glucose, Ur: NEGATIVE mg/dL
Ketones, Urine: >80 mg/dL — AB
Leukocyte Esterase, Urine: NEGATIVE
Nitrite, Urine: NEGATIVE
Protein, UA: >=300 mg/dL — AB
RBC, UA: 0 /HPF
Specific Gravity, UA: >1.03 — ABNORMAL HIGH (ref 1.005–1.030)
Urobilinogen, Urine: 1 EU/dL (ref 0.0–1.0)
WBC, UA: 0 /HPF
pH, Urine: 7 (ref 5.0–8.0)

## 2024-01-20 LAB — WET PREP, GENITAL
Trich, Wet Prep: NONE SEEN
Yeast, Wet Prep: NONE SEEN

## 2024-01-20 LAB — MAGNESIUM: Magnesium: 1.7 mg/dL (ref 1.6–2.6)

## 2024-01-20 MED ORDER — METOCLOPRAMIDE HCL 5 MG/ML IJ SOLN
5 | Freq: Once | INTRAMUSCULAR | Status: AC
Start: 2024-01-20 — End: 2024-01-20

## 2024-01-20 MED ORDER — DIPHENHYDRAMINE HCL 50 MG/ML IJ SOLN
50 | INTRAMUSCULAR | Status: DC
Start: 2024-01-20 — End: 2024-01-20

## 2024-01-20 MED ORDER — ACETAMINOPHEN 325 MG PO TABS
325 | ORAL | Status: AC
Start: 2024-01-20 — End: 2024-01-20
  Administered 2024-01-20: 20:00:00 650 via ORAL

## 2024-01-20 MED ORDER — ACETAMINOPHEN 325 MG PO TABS
325 | Freq: Once | ORAL | Status: AC
Start: 2024-01-20 — End: 2024-01-20

## 2024-01-20 MED ORDER — SODIUM CHLORIDE 0.9 % IV BOLUS
0.9 | Freq: Once | INTRAVENOUS | Status: AC
Start: 2024-01-20 — End: 2024-01-20
  Administered 2024-01-20: 20:00:00 1000 mL via INTRAVENOUS

## 2024-01-20 MED ORDER — SODIUM CHLORIDE 0.9 % IV BOLUS
0.9 | Freq: Once | INTRAVENOUS | Status: AC
Start: 2024-01-20 — End: 2024-01-20
  Administered 2024-01-20: 21:00:00 1000 mL via INTRAVENOUS

## 2024-01-20 MED ORDER — METRONIDAZOLE 500 MG PO TABS
500 | Freq: Once | ORAL | Status: AC
Start: 2024-01-20 — End: 2024-01-20
  Administered 2024-01-20: 21:00:00 500 mg via ORAL

## 2024-01-20 MED ORDER — METRONIDAZOLE 500 MG PO TABS
500 | ORAL_TABLET | Freq: Two times a day (BID) | ORAL | 0 refills | 7.00000 days | Status: AC
Start: 2024-01-20 — End: 2024-01-27

## 2024-01-20 MED ORDER — DIPHENHYDRAMINE HCL 50 MG/ML IJ SOLN
50 | Freq: Once | INTRAMUSCULAR | Status: AC
Start: 2024-01-20 — End: 2024-01-20
  Administered 2024-01-20: 20:00:00 25 mg via INTRAVENOUS

## 2024-01-20 MED ORDER — METOCLOPRAMIDE HCL 10 MG PO TABS
10 | ORAL_TABLET | Freq: Four times a day (QID) | ORAL | 0 refills | 30.00000 days | Status: AC
Start: 2024-01-20 — End: 2024-01-23

## 2024-01-20 MED ORDER — METOCLOPRAMIDE HCL 5 MG/ML IJ SOLN
5 | INTRAMUSCULAR | Status: AC
Start: 2024-01-20 — End: 2024-01-20
  Administered 2024-01-20: 20:00:00 10 via INTRAVENOUS

## 2024-01-20 MED FILL — METOCLOPRAMIDE HCL 5 MG/ML IJ SOLN: 5 mg/mL | INTRAMUSCULAR | Qty: 2 | Fill #0

## 2024-01-20 MED FILL — ACETAMINOPHEN 325 MG PO TABS: 325 mg | ORAL | Qty: 2 | Fill #0

## 2024-01-20 MED FILL — DIPHENHYDRAMINE HCL 50 MG/ML IJ SOLN: 50 mg/mL | INTRAMUSCULAR | Qty: 1 | Fill #0

## 2024-01-20 MED FILL — METRONIDAZOLE 500 MG PO TABS: 500 mg | ORAL | Qty: 1 | Fill #0

## 2024-01-20 NOTE — ED Notes (Signed)
"  L&D in room assessing fetal heart tones at this time.   "

## 2024-01-20 NOTE — ED Provider Notes (Signed)
 "Independent APP Visit.     HPI:  01/20/24,   Time: 2:12 PM EST         Judith Bryant is a 33 y.o. female presenting to the ED for abdominal pain nausea vomiting diarrhea, beginning 5 days ago.  The complaint has been persistent, moderate in severity, and worsened by eating or drinking .     Patient comes in with complaint of nausea vomiting diarrhea has been ongoing throughout the pregnancy developing diarrhea yesterday.  She states she is having some right lower quadrant cramping pain.  Not having any vaginal discharge or vaginal bleeding.  Patient is gravida 3 para 2 Ab0.  Patient has had similar symptoms with previous pregnancies.  No sick contacts at home.  ROS:   Pertinent positives and negatives are stated within HPI, all other systems reviewed and are negative.  --------------------------------------------- PAST HISTORY ---------------------------------------------  Past Medical History:  has no past medical history on file.    Past Surgical History:  has a past surgical history that includes Cesarean section and Cesarean section.    Social History:  reports that she quit smoking about 6 years ago. She has never used smokeless tobacco. She reports that she does not currently use alcohol. She reports that she does not use drugs.    Family History: family history is not on file.     The patients home medications have been reviewed.    Allergies: Patient has no known allergies.    -------------------------------------------------- RESULTS -------------------------------------------------  All laboratory and radiology results have been personally reviewed by myself   LABS:  Results for orders placed or performed during the hospital encounter of 01/20/24   Wet prep, genital    Specimen: Vagina   Result Value Ref Range    Source Wet Prep .VAGINA     Trich, Wet Prep None Seen None Seen    Yeast, Wet Prep None Seen None Seen    Clue Cells, Wet Prep 1+ (A) None Seen   CBC with Auto Differential   Result Value Ref  Range    WBC 6.6 4.5 - 11.5 k/uL    RBC 4.27 3.50 - 5.50 m/uL    Hemoglobin 12.5 11.5 - 15.5 g/dL    Hematocrit 62.8 65.9 - 48.0 %    MCV 86.9 80.0 - 99.9 fL    MCH 29.3 26.0 - 35.0 pg    MCHC 33.7 32.0 - 34.5 g/dL    RDW 87.5 88.4 - 84.9 %    Platelets 384 130 - 450 k/uL    MPV 9.1 7.0 - 12.0 fL    Neutrophils % 71 43.0 - 80.0 %    Lymphocytes % 18 (L) 20.0 - 42.0 %    Monocytes % 7 2.0 - 12.0 %    Eosinophils % 3 0 - 6 %    Basophils % 0 0.0 - 2.0 %    Immature Granulocytes % 0 0.0 - 5.0 %    Neutrophils Absolute 4.65 1.80 - 7.30 k/uL    Lymphocytes Absolute 1.21 (L) 1.50 - 4.00 k/uL    Monocytes Absolute 0.45 0.10 - 0.95 k/uL    Eosinophils Absolute 0.21 0.05 - 0.50 k/uL    Basophils Absolute 0.02 0.00 - 0.20 k/uL    Immature Granulocytes Absolute <0.03 0.00 - 0.58 k/uL   Comprehensive Metabolic Panel w/ Reflex to MG   Result Value Ref Range    Sodium 138 136 - 145 mmol/L    Potassium 3.4 (L) 3.5 - 5.1  mmol/L    Chloride 102 98 - 107 mmol/L    CO2 24 22 - 29 mmol/L    Anion Gap 12 7 - 16 mmol/L    Glucose 99 74 - 99 mg/dL    BUN 7 6 - 20 mg/dL    Creatinine 0.5 0.5 - 1.0 mg/dL    Est, Glom Filt Rate >90 >60 mL/min/1.74m2    Calcium 9.7 8.6 - 10.0 mg/dL    Total Protein 7.6 6.4 - 8.3 g/dL    Albumin 3.9 3.5 - 5.2 g/dL    Total Bilirubin 0.4 0.0 - 1.2 mg/dL    Alkaline Phosphatase 91 35 - 104 U/L    ALT 9 0 - 35 U/L    AST 10 0 - 35 U/L   Lactic Acid   Result Value Ref Range    Lactic Acid 0.8 0.5 - 2.2 mmol/L   Urinalysis with Microscopic   Result Value Ref Range    Color, UA Yellow Yellow    Turbidity UA Clear Clear    Glucose, Ur NEGATIVE NEGATIVE mg/dL    Bilirubin, Urine SMALL (A) NEGATIVE    Ketones, Urine >80 (A) NEGATIVE mg/dL    Specific Gravity, UA >1.030 (H) 1.005 - 1.030    Urine Hgb TRACE (A) NEGATIVE    pH, Urine 7.0 5.0 - 8.0    Protein, UA >=300 (A) NEGATIVE mg/dL    Urobilinogen, Urine 1.0 0.0 - 1.0 EU/dL    Nitrite, Urine NEGATIVE NEGATIVE    Leukocyte Esterase, Urine NEGATIVE NEGATIVE    WBC, UA  0 TO 5 0 TO 5 /HPF    RBC, UA 0 TO 2 0 TO 2 /HPF   Magnesium   Result Value Ref Range    Magnesium 1.7 1.6 - 2.6 mg/dL   POC Pregnancy Urine Qual   Result Value Ref Range    HCG, Urine, POC Positive Negative    Lot Number [id number]     Positive QC Pass/Fail Pass     Negative QC Pass/Fail Pass        RADIOLOGY:  Interpreted by Radiologist.  US  OB 14 PLUS WEEKS SINGLE OR FIRST GESTATION   Final Result   A single live intrauterine pregnancy with estimated gestational age of [redacted]   weeks and 5 days of development by ultrasound.      The estimated fetal weight is 166 g +/-25 g.      Fetal heart rate: 153 b.p.m.SABRA             ------------------------- NURSING NOTES AND VITALS REVIEWED ---------------------------   The nursing notes within the ED encounter and vital signs as below have been reviewed.   BP 136/74   Pulse 94   Temp 98.2 F (36.8 C) (Oral)   Resp 16   Ht 1.626 m (5' 4)   Wt 99.8 kg (220 lb)   LMP 09/26/2023   SpO2 96%   BMI 37.76 kg/m   Oxygen Saturation Interpretation: Normal      ---------------------------------------------------PHYSICAL EXAM--------------------------------------    Constitutional/General: Alert and oriented x3, well appearing, non toxic in NAD  Head: NC/AT  Eyes: PERRL, EOMI  ENT: Nares are normal, posterior pharynx is normal with no redness or swelling,  no cervical lymphadenitis is present  Mouth: Oropharynx clear, handling secretions, no trismus  Neck: Supple, full ROM, no meningeal signs  Back: Patient has full range of motion, no vertebral or intervertebral tenderness, negative straight leg test bilaterally, no red flag symptoms, no saddle anesthesia,  no abdominal pain, no weakness in the upper or lower extremities 5 out of 5 strength in all 4 extremities  Pulmonary: Lungs clear to auscultation bilaterally, no wheezes, rales, or rhonchi. Not in respiratory distress  Cardiovascular:  Regular rate and rhythm, no murmurs, gallops, or rubs. 2+ distal pulses  Abdomen: Soft, mild  tenderness suprapubic gravid uterus, no CVA tenderness no guarding  Extremities: Moves all extremities x 4. Warm and well perfused  Skin: warm and dry without rash  Neurologic: GCS 15,  Psych: Normal Affect      ------------------------------ ED COURSE/MEDICAL DECISION MAKING----------------------  Medications   sodium chloride  0.9 % bolus 1,000 mL (0 mLs IntraVENous Stopped 01/20/24 1614)   metoclopramide  (REGLAN ) injection 10 mg (10 mg IntraVENous Given 01/20/24 1517)   diphenhydrAMINE  (BENADRYL ) injection 25 mg (25 mg IntraVENous Given 01/20/24 1516)   acetaminophen  (TYLENOL ) tablet 650 mg (650 mg Oral Given 01/20/24 1513)   sodium chloride  0.9 % bolus 1,000 mL (0 mLs IntraVENous Stopped 01/20/24 1723)   metroNIDAZOLE  (FLAGYL ) tablet 500 mg (500 mg Oral Given 01/20/24 1614)     ED Course as of 01/21/24 0531   Wed Jan 20, 2024   1839 Patient tolerated p.o. fluid challenge in the emergency room well. [NS]      ED Course User Index  [NS] Saalinger, Laneta HERO, APRN - CNP      Medical Decision Making  Patient is [redacted] weeks pregnant and having persistent nausea vomiting diarrhea.  She had some lower abdominal cramping which resolved after IV fluids Reglan  Benadryl .  Ultrasound showing intrauterine pregnancy with fetal heart tones 153.  Patient has history of hyperemesis gravidarum with previous pregnancies.  Wet prep positive for bacterial vaginosis she was started on Flagyl .    Amount and/or Complexity of Data Reviewed  External Data Reviewed: labs, radiology and notes.  Labs: ordered. Decision-making details documented in ED Course.  Radiology: ordered and independent interpretation performed. Decision-making details documented in ED Course.  ECG/medicine tests:  Decision-making details documented in ED Course.    Risk  OTC drugs.  Prescription drug management.       1530 Nausea improved     1805 patient updated.  She states her nausea and pain have improved.  Patient presents to the ER for abdominal pain nausea  vomiting diarrhea in pregnancy.   The historian that provided information patient  Social Determinants include   Social Connections: Not on file    Social Determinants : None.   Chronic conditions that may affect care  History reviewed. No pertinent past medical history..    Physical exam Constitutional/General: Alert and oriented x3, well appearing, non toxic in NAD  Head: NC/AT  Eyes: PERRL, EOMI  ENT: Nares are normal, posterior pharynx is normal with no redness or swelling,  no cervical lymphadenitis is present  Mouth: Oropharynx clear, handling secretions, no trismus  Neck: Supple, full ROM, no meningeal signs  Back: Patient has full range of motion, no vertebral or intervertebral tenderness, negative straight leg test bilaterally, no red flag symptoms, no saddle anesthesia, no abdominal pain, no weakness in the upper or lower extremities 5 out of 5 strength in all 4 extremities  Pulmonary: Lungs clear to auscultation bilaterally, no wheezes, rales, or rhonchi. Not in respiratory distress  Cardiovascular:  Regular rate and rhythm, no murmurs, gallops, or rubs. 2+ distal pulses  Abdomen: Soft, mild tenderness suprapubic gravid uterus, no CVA tenderness no guarding  Extremities: Moves all extremities x 4. Warm and well perfused  Skin: warm and dry without rash  Neurologic: GCS 15,  Psych: Normal Affect.  Vital signs BP 136/74   Pulse 94   Temp 98.2 F (36.8 C) (Oral)   Resp 16   Ht 1.626 m (5' 4)   Wt 99.8 kg (220 lb)   LMP 09/26/2023   SpO2 96%   BMI 37.76 kg/m .      Differential diagnoses include but not limited to electrolyte imbalance, dehydration UTI, threatened miscarriage among other.     Diagnostic studies including labs and imagining which was interpreted by radiologist reveals see above.      Consults included none.   Results were discussed with patient.    Patient was given 2 L normal saline, Flagyl  Reglan  Benadryl  for their symptoms with good improvement.  Patient will be discharged home  with the following prescriptions, Reglan  Flagyl .    Discussed appropriate use and potential side effects of starting the prescribed medications.   Patient continues to be non-toxic on re-evaluation.   Findings were discussed with the patient and reasons to immediately return to the ED were articulated to them.   They will follow-up with their PMD and OB/GYN.      Record Review:  Records Reviewed : Source recent emergency encounters       Disposition Considerations:  This patient's ED course included: a personal history and physicial examination, re-evaluation prior to disposition, multiple bedside re-evaluations, and IV medications  This patient has remained hemodynamically stable during their ED course.     Discharge Instructions:   Patient referred to  Albina Zona MATSU, MD  [address] [place] [address]  [phone]    Call in 1 day      [facility] Emergency Department  [address]  [place]  [address]  [phone]    If symptoms worsen      I am the primary clinician this case     MEDICATIONS:   DISCHARGE MEDICATIONS:  Discharge Medication List as of 01/20/2024  7:15 PM        START taking these medications    Details   metroNIDAZOLE  (FLAGYL ) 500 MG tablet Take 1 tablet by mouth 2 times daily for 7 days, Disp-14 tablet, R-0Normal      metoclopramide  (REGLAN ) 10 MG tablet Take 1 tablet by mouth 4 times daily for 3 days, Disp-12 tablet, R-0Normal             DISCONTINUED MEDICATIONS:  Discharge Medication List as of 01/20/2024  7:15 PM        STOP taking these medications       brompheniramine-pseudoephedrine-DM 2-30-10 MG/5ML syrup Comments:   Reason for Stopping:               I emphasized the importance of follow-up with the physician I referred them to in the timeframe recommended.  I discussed with the patient emergent symptoms and the need to immediately return to the ER. Written information was included in their discharge instructions. Additional verbal discharge  instructions were also given and discussed with the patient to supplement those generated by the EMR. We also discussed medications that were prescribed  (if any) including common side effects and interactions. The patient was advised to abstain from driving, operating heavy machinery or making significant decisions while taking medications such as opiates and muscle relaxers that may impair this. All questions were addressed.  They understand return precautions and discharge instructions. The patient  expressed understanding. Vitals  were stable and they were in no distress at discharge.      Counseling:   The emergency provider has spoken with the patient and discussed todays results, in addition to providing specific details for the plan of care and counseling regarding the diagnosis and prognosis.  Questions are answered at this time and they are agreeable with the plan.      --------------------------------- IMPRESSION AND DISPOSITION ---------------------------------    IMPRESSION  1. Bacterial vaginosis    2. Abdominal pain during pregnancy in second trimester    3. Nausea and vomiting, unspecified vomiting type        DISPOSITION  Disposition: Discharge to home  Patient condition is good             "

## 2024-01-20 NOTE — Discharge Instructions (Addendum)
 Contact your OB/GYN tomorrow morning to schedule outpatient follow-up in the next 1 to 3 days.  Return to the emergency room immediately with any change in your condition/worsening or concerns.

## 2024-01-26 LAB — CREATININE AND GFR

## 2024-02-10 ENCOUNTER — Ambulatory Visit: Admit: 2024-02-10 | Payer: Medicaid (Managed Care)

## 2024-02-10 ENCOUNTER — Ambulatory Visit: Admit: 2024-02-10 | Discharge: 2024-02-10 | Payer: Medicaid (Managed Care) | Attending: Maternal & Fetal Medicine

## 2024-02-10 NOTE — Progress Notes (Signed)
 "    February 10, 2024      Albina Zona MATSU, MD  [address] [place],  [place] [id number]     RE:  Judith Bryant  DOB: May 10, 1990   AGE: 33 y.o.     This note was created using voice recognition software.  As such, this documented note may contain errors, misspellings, and/or omissions inherent in voice recognition technology.    Dear Dr. Albina:    Thank you for requesting initial outpatient Maternal-Fetal Medicine consultation and evaluation on your patient, Ms. Judith Bryant .  As you are aware, the patient is a 33 y.o.  [id number] with a best estimated due date for the pregnancy of Estimated Date of Delivery: 07/02/24 .  The due date is based on an established last menstrual period and a previously completed ultrasound evaluation.  This places her at [redacted]w[redacted]d gestation at the time of her initial consultation.  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  She acknowledges active fetal movements currently.  The patient denies any current headaches or blurry vision, vaginal bleeding, abdominal cramping, back pain or pelvic pressure, and states that she is otherwise doing well.  The patient reports 0 out of 10 on the pain scale.       PAST OB HISTORY:  Ms. Judith Bryant  reports that this is her fourth pregnancy.  Patient's first pregnancy was a primary cesarean section on 09/05/2012 at 39 gestational weeks with delivery of a female neonate weighing 6 pounds 14 ounces patient states that the primary cesarean section was secondary to nonreassuring fetal heart tones.  This was followed by a repeat cesarean section on 01/08/2023 at 39 gestational weeks of a female neonate weighing 6 pounds 12 ounces.  Patient states that both of her previous pregnancies were complicated by maternal proteinuria.  Both  deliveries were in [place], [place] .  Patient denied any diabetic complications during her previous pregnancies and denied any hypertensive disorders of pregnancies.  Current pregnancy is conceived with the same father of the baby as her last pregnancy.  The father of the baby is also fathered 6 additional healthy children.  Patient completed noninvasive prenatal testing on 12/18/2023 which resulted a low risk for fetal aneuploidy.  Patient also had negative testing for 27 conditions on the [employer] carrier screen.    PAST GYN HISTORY:  Negative.      PAST MEDICAL HISTORY:    1.  Maternal proteinuria.  Patient states that she had this during her 2 previous pregnancies as well.  Patient is clinically asymptomatic.  Patient adamantly denies a history of hypertensive disorders of pregnancy, pregestational diabetes, connective tissue disorders, systemic lupus erythematosus, or any other systemic symptomatology.  Patient completed a 24-hour urine collection on 01/26/2024 which resulted 701 mg of protein in 24 hours.  Fortunately, on patient's laboratory evaluation from 01/20/2024, patient's serum BUN and creatinine were 7 and 0.5, respectively.  Previously on 07/17/2016, patient's serum BUN and creatinine were 11 and 0.6, respectively.  As such, patient states that she has no long history of any kidney problems.  However, patient does state that she was seen by an adult nephrologist during her 2 previous pregnancies in [place] .  2.  Maternal morbid obesity, BMI 38.04 kg/m  Unremarkable for a past medical history of chronic hypertension, pregestational diabetes, thyroid dysfunction, maternal asthma, venous thromboembolic events, generalized anxiety disorder, major depressive disorder, etc.    PAST SURGICAL HISTORY:        Procedure Laterality Date    CESAREAN SECTION      CESAREAN SECTION           ALLERGIES:    No Known Allergies     MEDICATIONS:   The patient also does state that she takes a low-dose  aspirin 81 mg orally on a daily basis.  Current Outpatient Medications on File Prior to Visit   Medication Sig Dispense Refill    metoclopramide  (REGLAN ) 10 MG tablet Take 1 tablet by mouth 4 times daily for 3 days 12 tablet 0     No current facility-administered medications on file prior to visit.        SOCIAL HISTORY:  The patient denies any current smoking, alcohol or recreational drug use.  However, patient does admit to usage of marijuana in the first trimester of the current pregnancy.    FAMILY MEDICAL HISTORY:    Unremarkable.  There is no family history of any children born with any congenital birth defects, including no previous children born with congenital heart defects, cleft lip or cleft palate, neural tube defects, polydactyly, etc.  In addition, there is also no family history of fetal aneuploidy, hereditary genetic disorders, autism/developmental delay/learning difficulties, etc.      Furthermore, there is no family history of any type 2 diabetes in any first-degree relatives, no family history of hypertensive disorders of pregnancy in any first-degree relatives, and no family history of any venous thromboembolic events in any first-degree relative to the patient.    On exam, the patient is awake, alert and in no acute distress.  She is oriented x 3.     VITAL SIGNS:  BP 122/80   Pulse (!) 102   Temp 97.8 F (36.6 C) (Temporal)   Wt 100.5 kg (221 lb 9.6 oz)   LMP 09/26/2023   SpO2 94%   BMI 38.04 kg/m     No results found for this visit on 02/10/24.      GENERAL APPEARANCE:  The patient is well-groomed and has a normal appearance.   Her abdomen is soft, nontender with no rebound and no guarding.  Extremities are nontender.  There is no evidence of edema present.       ASSESSMENT AND PLAN:  I have discussed the following with the patient, Judith Bryant today:      1. The patient has been informed that the incidence of birth defects in the general population is 2 to 5% and that ultrasound  does not diagnose every form of congenital abnormality and has its limitations.  In addition, the patient is fully aware that the only definitive way to evaluate the fetal chromosomal makeup (i.e., karyotype) is with an invasive prenatal diagnostic test, such as an amniocentesis.    2.  Patient was referred to the maternal-fetal medicine office for further evaluation, and management for proteinuria.  Although the patient did have 701 mg of protein in 24 hours resulted on 01/26/2024, patient also did have a recent serum creatinine value of 0.5 on 01/20/2024.  Patient adamantly denies any history of chronic kidney disease and also adamantly denies any history of other maternal morbidities such as hypertensive disorders of pregnancy, diabetic complications, connective tissue disorders, systemic lupus erythematosus, etc. however, as the patient has had previous adult  nephrology consultation during her 2 previous pregnancies in [place] , patient prefers to also complete an antepartum adult nephrology consultation in the current pregnancy as well.  Therefore, this referral will be provided for further evaluation, management and care from the adult nephrology service line.  Outlined below are potential implications and complications associated with renal disease/kidney disease in pregnancy.    As I have discussed with the patient today, there are some normal physiologic changes that occur during the course of pregnancy in normal gestation.   In general, serum maternal creatinine levels decrease in pregnant women compared to the non-pregnant state as a result of increases in the glomerular filtration rate by as much as 50% shortly after pregnancy is achieved.  As a result of this, serum creatinine levels in pregnancy may be as low as 0.6 mg/dL from a pre-pregnancy level of 0.8 mg/dL.   These are normal changes that occur in normal gestations.   Once again, the increase in the glomerular filtration rate is also a  result of an increase in the renal plasma flow that occurs in normal pregnancies, causing this decrease in serum creatinine levels.       In general, if maternal renal dysfunction is strongly suspected, appropriate evaluation of this would be strongly encouraged prior to conception.   Specifically, the serum creatinine level may be correlated with a multitude of adverse perinatal outcomes that may occur once pregnancy is achieved.   In most women who have mild renal dysfunction, pregnancy usually progresses without affecting or compromising further renal dysfunction in the mother.  Mild renal impairment may be considered if the serum creatinine level is less than 1.4 mg/dL.  However, such pregnancies still may be at increased risks for certain adverse perinatal outcomes such as fetal intrauterine growth restriction, indicated preterm deliveries, hypertensive disorders of pregnancy, perinatal demise, etc.   Some of these risks may be as high as 25 to 30%.  In those women with moderate renal impairment, considered if the serum creatinine level is between 1.4 and 2.0 mg/dL, some of the above perinatal complication rates may be as high as 60%.  In those women with severe renal impairment with serum creatinine levels of 2.0 mg/dL, an indicated preterm delivery rate may be as high as 95%.   In addition, along with suspected renal dysfunction, development of other maternal hypertensive disorders of pregnancy, increase in maternal proteinuria, recurrent urinary tract infections, etc. should also be closely monitored for.   In pregnancies complicated by a strong clinical suspicion for any sort of renal impairment, regular interval fetal growth scans of the fetus/neonate after 24 gestational weeks on an every three to four week basis should be strongly encouraged and fetal antenatal surveillance should also be initiated at 32 gestational weeks with at least non-stress testing twice weekly and weekly evaluation of amniotic  fluid volume, biophysical profile testing, etc.  As it becomes clinically required as well, umbilical artery Doppler wave forms should also be performed, especially if there is a further compromise or deterioration in fetal size, the development of other hypertensive disorders of pregnancy, etc.      Therefore, in pregnancies with the above renal impairment/dysfunction, strong consideration for the above protocol would be advisable.  In addition, as it becomes clinically required, coordination of the patient's care should also be made in conjunction with a medical nephrologist, etc.  In addition, the patient should be also closely monitored for any change in maternal symptoms such as the development of headaches, blurry  vision, right upper quadrant pain, etc.   In general, patients with significant renal compromise may be at increased risk for the development and/or progression of hypertensive disorders of pregnancy.  If all of the above do develop and progress, a multi-disciplinary plan of coordination of the patient's care would be strongly advisable.  In addition, given the fact that the above pregnancies are at very high risk for an indicated preterm delivery, if many of the above factors develop between 24 and 34 gestational weeks, a course of betamethasone administration should be considered, inpatient hospital admission, etc.      In addition, the patient may require periodic evaluation of 24-hour urine collections for total protein as well as creatinine clearance, interval evaluations of maternal serum BUN and creatinine, etc.   If such patients remain otherwise undelivered with the implementation of the above plan of care, I would advise delivery at 39 and 0/7 weeks of gestational age.  However, if there is any deterioration and/or compromise in maternal and/or fetal status prior to this point in time, there may be a requirement for delivery prior to this point as well.        3.  OBESITY    The patient's  pregnancy is complicated by maternal obesity.  I recommend a maximum weight gain in pregnancy of 10-15 pounds to minimize the potential adverse pregnancy complications associated with obesity in pregnancy, including higher rates of congenital birth defects, hypertensive disorders of pregnancy, gestational diabetes/diabetes complications, large for gestational age fetus, polyhydramnios, unexplained antepartum stillbirth, increased rates of delivery by cesarean section, difficulty at the time of delivery (including shoulder dystocia and metabolic derangements in the neonate), etc.    Given the increased association and possibility of an antepartum unexplained intrauterine fetal demise for a pregnancy complicated by morbid obesity (specifically for a maternal body mass index/BMI greater than 35 kg/m2 early in gestation or pre-pregnancy) and the other medical/obstetrical complications of pregnancy, I would strongly suggest the initiation of fetal antenatal surveillance with non-stress testing at least weekly from 32 gestational weeks.      4.  ASPIRIN    The American College of Obstetricians and Gynecologists (ACOG) and the Society for Maternal-Fetal Medicine in ACOG Committee Opinion [phone] entitled Low-Dose Aspirin Use During Pregnancy, published in Obstetrics and Gynecology in Volume #132, #1, from July of 2018, outlined the usage of low-dose aspirin and the prevention of preeclampsia.  This Committee Opinion supports the recommendations previously put forth by the [place]  Preventative Services Task Force recommendations on the same topic.  Therefore, based on these recommendations and on expanding and evolving expert consensus, ACOG now recommends low-dose aspirin should be initiated between 12 weeks and 28 weeks of gestational age (with optimal initiation of the baby aspirin between 33 and 16 gestational weeks) and this daily usage should be continued until delivery.  There is no apparent benefit to stopping  this low-dose aspirin regimen before delivery.  Study protocols that are unique to pregnancy have varied in their opinion, with some studies advocating discontinuing the low-dose aspirin regimen at 36 gestational weeks and other studies have shown benefit to continuing the low-dose aspirin until the delivery.  Discontinuation of the baby aspirin at 36 gestational weeks has not been related to any excessive maternal and/or fetal bleeding.  Therefore, the low-dose aspirin regimen can be continued daily until delivery.  In the [place]  Preventative Services Task Force recommendations, pregnant women are considered to be at high-risk for preeclampsia if one or more of  the following major risk factors are present:      A prior personal history of preeclampsia, especially if it is also accompanied by an adverse outcome.  Multi-fetal gestation.  Chronic hypertension.  Pregestational diabetes, either type 1 or type 2.  Renal disease.  Autoimmune disorders, such as systemic lupus erythematosus, antiphospholipid antibody syndrome, etc.      Furthermore, this Task Force also has identified moderate risk factors, for which low-dose aspirin might be considered if several of the following risk factors are present, and these include the following:  Nulliparity, maternal obesity with a body mass index greater than 30 kg/m2, family history of preeclampsia in a first degree relative such as a mother or a sister, advanced maternal age, African-American ethnicity, low socioeconomic status, a personal maternal historical factors including a previous adverse perinatal outcome, and more than a 10-year pregnancy interval.  As such, based on these considerations, ACOG supports the recommendations to consider the use of low-risk aspirin (81 mg per day) to be initiated between 12 and 28 weeks of gestation if the patient has one major risk factor or above or if they have multiple moderate risk factors as above, for the prevention of  preeclampsia. The above recommendations should also be taken in context with the remainder of the patient's medical and obstetrical complications of pregnancy, and the above factors need to be considered on an individualized, case-by-case basis.  For low-risk patients, considered to be those with a previous uncomplicated full-term delivery, low-dose aspirin initiation is not recommended.       5.  Given the patient's morbid maternal obesity, I would advise completion of an early 1 hour Glucola to evaluate for pregestational diabetes.  Please refer to the guidelines as below.    ACOG Recommendation for Early Glucose Screen Diabetes    Per the ACOG Recommendation for Early Glucose Screening and Diabetes, the patient meets criteria for an early 1-hour glucose tolerance test.  ACOG has adopted the NIDDK/ADA guidance on screening for diabetes and prediabetes.  These take into account not only previous pregnancy history but also risk factors associated with type 2 diabetes.    In review, per ACOG Practice Bulletin No. 190 entitled Gestational Diabetes Mellitus,  as published in Obstetrics and Gynecology, Volume 131 No. 2, February 2018, several criteria are outlined for diabetes screening in early pregnancy.  Again, according to the recommendations, diabetes screening early in pregnancy should be be considered if the patient is overweight with a BMI of 25 (or 23 in Asian Americans), and has one of the following additional risk factors: physical inactivity; known impaired glucose metabolism; previous pregnancy with a history of gestational diabetes, macrosomia (newborn weight >/= 4,000 grams) or stillbirth.  In addition, women with the risk factors of having hypertension (140/90 mm/Hg or being treated for hypertension), HDL cholesterol <= 35 mg/dL, fasting triglyceride value >= 250 mg/dL, having a history of PCOS, etc. or other conditions associated with insulin resistance, having a hemoglobin A1C >= 5.7%, having  cardiovascular disease, having a family history of diabetes (including parent or sibling/first-degree relative), or having the following ethnic backgrounds:   Ethnicity of African American, American Indian, Asian American, Hispanic, Latina, or Pacific Islander would qualify as an additional risk factor leading to recommended early diabetes screening in pregnancy.  Again, please refer to the original publication for complete guidelines and recommendations.    As the patient does meet criteria for an early 1-hour glucose tolerance test, as a result I would advise early diabetes screening  with a 1-hour 50g glucose load so that the appropriate treatment, diet or insulin, can be initiated.  If early screening is negative, then I would advise repeating the test during 24 to 28 weeks' of gestation.  If gestational diabetes is diagnosed in the first trimester it is due to unrecognized Type 2 diabetes since the placental effects on maternal insulin and glucose metabolism do not take place until the late second and early third trimester.  I would advise managing pregnancies with gestational diabetes diagnosed in the first trimester similar to pregnancies complicated by pregestational diabetes: HgbA1C in the first trimester, fetal echocardiogram, 24 hour urine collection for protein, baseline electrolytes including BUN and creatinine, ophthalmology evaluation, interval fetal growth assessments at least every 4 weeks, and fetal antenatal surveillance starting from 32 gestational weeks with NST performed twice weekly along with weekly BPP.      6.  MARIJUANA EXPOSURE    The patient's pregnancy is complicated by marijuana usage.  This marijuana usage is either confirmed from the patient's historical provided information or has been confirmed by a positive urine drug screen for marijuana.  Although marijuana is not a teratogen, it has been associated with adverse perinatal events.  It has been associated with higher rates of  suboptimal fetal growth, intrauterine growth restriction, oligohydramnios, meconium staining of the amniotic fluid, etc., and also has been associated with neonatal and childhood complications such as learning difficulties, behavioral concerns, attention deficit hyperactive disorder, neurologic dysfunction, cerebral palsy, drug dependence, etc.  Therefore, the patient was strongly encouraged for cessation of all illicit drugs.     7.  SHORT INTERPREGNANCY INTERVAL    The patient's pregnancy has been complicated by a short interpregnancy interval.  Short interpregnancy intervals have been associated in some studies with adverse perinatal outcomes.  A short interpregnancy interval has been shown to be an independent risk factor associated with higher rates of preterm delivery, suboptimal fetal growth/intrauterine growth restriction, higher rates of neonatal death, etc.   I have explained the above risks to the patient today.  I have strongly urged her to be compliant with follow-up visits scheduled in your office as well as in ours given the potential increased risks associated with the short interpregnancy interval as outlined above.      8.  NIPT SENSITIVITIES (SINGLETON)    Cell-free DNA testing is the most sensitive and specific test for evaluation for the common fetal trisomies.  However, cell-free DNA testing is not equivalent to diagnostic testing (especially in the context of observed fetal structural abnormalities/anomalies, wherein even with a low-risk cell-free DNA test result, there remains a residual risk of the possibility of a fetal chromosomal abnormality).      A meta-analysis demonstrated that the sensitivity of non-invasive prenatal testing (NIPT)/cell-free DNA testing for singleton gestations is the following: For fetal chromosome 21 aneuploidy evaluation, the sensitivity is greater than 99% and the specificity is 99.8%; for fetal chromosome 18 aneuploidy evaluation, the sensitivity is 98% and  for fetal chromosome 13 aneuploidy evaluation, the sensitivity of non-invasive prenatal testing is 99% (with a combined false-positive rate of .13%).  For those patients where these NIPT testing results are deemed uninterpretable or are not reported by the laboratory, there is an increased risk of fetal aneuploidy in as many as 2.7% of these specimens.    With a screen positive NIPT result, confirmatory invasive testing is strongly advised with either chorionic villus sampling or genetic amniocentesis.  False-positive results can occur secondary to mosaicism, a vanishing  twin, a duplicated chromosomal region, an underlying maternal malignancy or condition, etc.  While false-positive rates are low, when these results do occur, the positive predictive value is the primary factor that should be considered.  Furthermore, although false-negative rates are low, they are still possible secondary to mosaicism, sampling error, low fetal fractions, etc.      9.  FETAL ANTENATAL SURVEILLANCE    Fetal antenatal surveillance (including non-stress testing, amniotic fluid volume calculation, biophysical profile score evaluation, assessment of umbilical artery Doppler wave forms, and fetal middle cerebral artery peak systolic velocity Doppler wave forms, etc.) does not prevent unexplained stillbirths that may still result from acute and unpredictable obstetrical events, such as placental abruption, umbilical cord prolapse, umbilical cord accidents, etc.      I spent a total of approximately 30 minutes on the date of service in the Maternal-Fetal Medicine office, which included appropriate preparation time, reviewing and/or obtaining the pertinent medical and obstetrical history, interpreting various results and communicating these results to the patient, and direct face-to-face patient care. This also included completing appropriate and relevant clinical documentation and performing directed counseling and education. Furthermore,  this also included time to order appropriate laboratory tests, procedures, medications, providing as needed appropriate referrals to outside consultative physicians. Of this time, greater than 50% of the time was spent for counseling and coordination of care. Please note that this time does not include ultrasound examination time.    Best regards,      Marnie KYM Mercury, MD, MBA, FACOG         [facility]YS[facility] [place] [facility] [facility] MATERNAL FETAL MEDICINE  [address] [place] [address]  Dept: [phone]  Loc: [phone]     *All or parts of this note may have been generated using a voice recognition program. There may be typo, grammar, or Word substitution errors that have escaped my review of this note.   "

## 2024-02-10 NOTE — Progress Notes (Signed)
"  Patient presents today for scheduled outpatient ultrasound evaluation. Patient has no complaints today. Patient denies any active vaginal bleeding, leaking of fluid vaginally, current abdominal cramping or contractions, current headaches or blurry vision, etc.   "

## 2024-03-15 ENCOUNTER — Ambulatory Visit: Admit: 2024-03-15 | Payer: Medicaid (Managed Care)

## 2024-03-15 ENCOUNTER — Ambulatory Visit: Admit: 2024-03-15 | Discharge: 2024-03-15 | Payer: Medicaid (Managed Care) | Attending: Maternal & Fetal Medicine

## 2024-03-15 VITALS — BP 108/72 | HR 84 | Temp 97.90000°F | Wt 226.8 lb

## 2024-03-15 DIAGNOSIS — Z3A24 24 weeks gestation of pregnancy: Secondary | ICD-10-CM

## 2024-03-15 DIAGNOSIS — O1212 Gestational proteinuria, second trimester: Principal | ICD-10-CM

## 2024-03-15 NOTE — Progress Notes (Signed)
"  Please see attached Ultrasound Only completed today in the Maternal-Fetal Medicine office.     For the remainder of the patient's medical and obstetrical complications of pregnancy, please refer to all previous medical records, consultation letters, ultrasound evaluations, etc. from the Maternal-Fetal Medicine office.   "

## 2024-03-15 NOTE — Progress Notes (Signed)
"  Patient presents today for scheduled outpatient ultrasound evaluation. Patient has no complaints today. Patient denies any active vaginal bleeding, leaking of fluid vaginally, current abdominal cramping or contractions, current headaches or blurry vision, etc. Patient also states active fetal movements as well.     Unable to void  "
